# Patient Record
Sex: Female | Born: 1970 | Race: White | Hispanic: No | Marital: Married | Smoking: Never smoker
Health system: Southern US, Community
[De-identification: ages and names within clinical notes are randomized; demographics above are authoritative.]

## PROBLEM LIST (undated history)

## (undated) DIAGNOSIS — R51 Headache: Secondary | ICD-10-CM

## (undated) DIAGNOSIS — N926 Irregular menstruation, unspecified: Secondary | ICD-10-CM

## (undated) DIAGNOSIS — Z8759 Personal history of other complications of pregnancy, childbirth and the puerperium: Secondary | ICD-10-CM

## (undated) DIAGNOSIS — O1490 Unspecified pre-eclampsia, unspecified trimester: Secondary | ICD-10-CM

## (undated) DIAGNOSIS — R002 Palpitations: Secondary | ICD-10-CM

## (undated) DIAGNOSIS — F410 Panic disorder [episodic paroxysmal anxiety] without agoraphobia: Secondary | ICD-10-CM

## (undated) DIAGNOSIS — I10 Essential (primary) hypertension: Secondary | ICD-10-CM

## (undated) DIAGNOSIS — Z862 Personal history of diseases of the blood and blood-forming organs and certain disorders involving the immune mechanism: Secondary | ICD-10-CM

## (undated) DIAGNOSIS — R011 Cardiac murmur, unspecified: Secondary | ICD-10-CM

## (undated) DIAGNOSIS — E875 Hyperkalemia: Secondary | ICD-10-CM

## (undated) DIAGNOSIS — Z8639 Personal history of other endocrine, nutritional and metabolic disease: Secondary | ICD-10-CM

## (undated) DIAGNOSIS — N959 Unspecified menopausal and perimenopausal disorder: Secondary | ICD-10-CM

## (undated) DIAGNOSIS — F419 Anxiety disorder, unspecified: Secondary | ICD-10-CM

## (undated) DIAGNOSIS — Z87898 Personal history of other specified conditions: Secondary | ICD-10-CM

## (undated) HISTORY — DX: Unspecified pre-eclampsia, unspecified trimester: O14.90

## (undated) HISTORY — DX: Panic disorder (episodic paroxysmal anxiety): F41.0

## (undated) HISTORY — DX: Personal history of other endocrine, nutritional and metabolic disease: Z86.39

## (undated) HISTORY — DX: Palpitations: R00.2

## (undated) HISTORY — DX: Personal history of other complications of pregnancy, childbirth and the puerperium: Z87.59

## (undated) HISTORY — DX: Hyperkalemia: E87.5

## (undated) HISTORY — DX: Unspecified menopausal and perimenopausal disorder: N95.9

## (undated) HISTORY — DX: Personal history of diseases of the blood and blood-forming organs and certain disorders involving the immune mechanism: Z86.2

## (undated) HISTORY — DX: Personal history of other specified conditions: Z87.898

## (undated) HISTORY — DX: Cardiac murmur, unspecified: R01.1

## (undated) HISTORY — DX: Irregular menstruation, unspecified: N92.6

## (undated) HISTORY — DX: Headache: R51

## (undated) HISTORY — DX: Essential (primary) hypertension: I10

---
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
## 2000-11-08 ENCOUNTER — Inpatient Hospital Stay (HOSPITAL_COMMUNITY): Admission: AD | Admit: 2000-11-08 | Discharge: 2000-11-10 | Payer: Self-pay | Admitting: Obstetrics and Gynecology

## 2000-11-11 ENCOUNTER — Encounter: Admission: RE | Admit: 2000-11-11 | Discharge: 2000-12-11 | Payer: Self-pay | Admitting: Obstetrics and Gynecology

## 2001-02-26 ENCOUNTER — Other Ambulatory Visit: Admission: RE | Admit: 2001-02-26 | Discharge: 2001-02-26 | Payer: Self-pay | Admitting: Obstetrics and Gynecology

## 2001-12-13 ENCOUNTER — Encounter: Payer: Self-pay | Admitting: Obstetrics and Gynecology

## 2001-12-13 ENCOUNTER — Ambulatory Visit (HOSPITAL_COMMUNITY): Admission: RE | Admit: 2001-12-13 | Discharge: 2001-12-13 | Payer: Self-pay | Admitting: Obstetrics and Gynecology

## 2002-04-11 ENCOUNTER — Ambulatory Visit (HOSPITAL_COMMUNITY): Admission: RE | Admit: 2002-04-11 | Discharge: 2002-04-11 | Payer: Self-pay | Admitting: Obstetrics and Gynecology

## 2002-04-11 ENCOUNTER — Encounter: Payer: Self-pay | Admitting: Obstetrics and Gynecology

## 2002-05-20 ENCOUNTER — Inpatient Hospital Stay (HOSPITAL_COMMUNITY): Admission: AD | Admit: 2002-05-20 | Discharge: 2002-05-22 | Payer: Self-pay | Admitting: Obstetrics and Gynecology

## 2002-05-23 ENCOUNTER — Encounter: Admission: RE | Admit: 2002-05-23 | Discharge: 2002-06-22 | Payer: Self-pay | Admitting: Obstetrics and Gynecology

## 2002-07-01 ENCOUNTER — Other Ambulatory Visit: Admission: RE | Admit: 2002-07-01 | Discharge: 2002-07-01 | Payer: Self-pay | Admitting: Obstetrics and Gynecology

## 2003-03-22 ENCOUNTER — Emergency Department (HOSPITAL_COMMUNITY): Admission: EM | Admit: 2003-03-22 | Discharge: 2003-03-22 | Payer: Self-pay | Admitting: Emergency Medicine

## 2005-03-20 LAB — CONVERTED CEMR LAB: Pap Smear: NORMAL

## 2005-04-14 ENCOUNTER — Other Ambulatory Visit: Admission: RE | Admit: 2005-04-14 | Discharge: 2005-04-14 | Payer: Self-pay | Admitting: Obstetrics and Gynecology

## 2007-03-21 DIAGNOSIS — Z8759 Personal history of other complications of pregnancy, childbirth and the puerperium: Secondary | ICD-10-CM

## 2007-03-21 HISTORY — DX: Personal history of other complications of pregnancy, childbirth and the puerperium: Z87.59

## 2007-07-12 ENCOUNTER — Ambulatory Visit: Payer: Self-pay | Admitting: Internal Medicine

## 2007-07-12 DIAGNOSIS — R011 Cardiac murmur, unspecified: Secondary | ICD-10-CM

## 2007-07-12 DIAGNOSIS — M546 Pain in thoracic spine: Secondary | ICD-10-CM

## 2007-07-12 DIAGNOSIS — R51 Headache: Secondary | ICD-10-CM

## 2007-07-12 DIAGNOSIS — R030 Elevated blood-pressure reading, without diagnosis of hypertension: Secondary | ICD-10-CM

## 2007-07-12 DIAGNOSIS — R519 Headache, unspecified: Secondary | ICD-10-CM | POA: Insufficient documentation

## 2007-07-12 DIAGNOSIS — G4700 Insomnia, unspecified: Secondary | ICD-10-CM

## 2007-07-12 DIAGNOSIS — N943 Premenstrual tension syndrome: Secondary | ICD-10-CM | POA: Insufficient documentation

## 2007-07-12 HISTORY — DX: Cardiac murmur, unspecified: R01.1

## 2007-07-29 ENCOUNTER — Encounter: Payer: Self-pay | Admitting: Internal Medicine

## 2007-08-30 ENCOUNTER — Ambulatory Visit: Payer: Self-pay | Admitting: Internal Medicine

## 2007-09-01 LAB — CONVERTED CEMR LAB
ALT: 16 units/L (ref 0–35)
AST: 18 units/L (ref 0–37)
Albumin: 4.3 g/dL (ref 3.5–5.2)
Alkaline Phosphatase: 46 units/L (ref 39–117)
BUN: 11 mg/dL (ref 6–23)
Basophils Absolute: 0 10*3/uL (ref 0.0–0.1)
Basophils Relative: 0.4 % (ref 0.0–1.0)
Bilirubin, Direct: 0.1 mg/dL (ref 0.0–0.3)
CO2: 25 meq/L (ref 19–32)
Calcium: 9.4 mg/dL (ref 8.4–10.5)
Chloride: 104 meq/L (ref 96–112)
Cholesterol: 177 mg/dL (ref 0–200)
Creatinine, Ser: 0.7 mg/dL (ref 0.4–1.2)
Eosinophils Absolute: 0 10*3/uL (ref 0.0–0.7)
Eosinophils Relative: 0.7 % (ref 0.0–5.0)
GFR calc Af Amer: 122 mL/min
GFR calc non Af Amer: 101 mL/min
Glucose, Bld: 87 mg/dL (ref 70–99)
HCT: 39.5 % (ref 36.0–46.0)
HDL: 66 mg/dL (ref >39.0)
Hemoglobin: 13.8 g/dL (ref 12.0–15.0)
LDL Cholesterol: 103 mg/dL — ABNORMAL HIGH (ref 0–99)
Lymphocytes Relative: 31.2 % (ref 12.0–46.0)
MCHC: 34.9 g/dL (ref 30.0–36.0)
MCV: 86.5 fL (ref 78.0–100.0)
Monocytes Absolute: 0.5 10*3/uL (ref 0.1–1.0)
Monocytes Relative: 8.2 % (ref 3.0–12.0)
Neutro Abs: 3.6 10*3/uL (ref 1.4–7.7)
Neutrophils Relative %: 59.5 % (ref 43.0–77.0)
Platelets: 210 10*3/uL (ref 150–400)
Potassium: 3.4 meq/L — ABNORMAL LOW (ref 3.5–5.1)
RBC: 4.56 M/uL (ref 3.87–5.11)
RDW: 12.5 % (ref 11.5–14.6)
Sodium: 138 meq/L (ref 135–145)
TSH: 2.37 microintl units/mL (ref 0.35–5.50)
Total Bilirubin: 0.7 mg/dL (ref 0.3–1.2)
Total CHOL/HDL Ratio: 2.7
Total Protein: 7.2 g/dL (ref 6.0–8.3)
Triglycerides: 40 mg/dL (ref 0–149)
VLDL: 8 mg/dL (ref 0–40)
WBC: 5.9 10*3/uL (ref 4.5–10.5)

## 2007-09-06 ENCOUNTER — Ambulatory Visit: Payer: Self-pay | Admitting: Internal Medicine

## 2007-09-06 DIAGNOSIS — N926 Irregular menstruation, unspecified: Secondary | ICD-10-CM

## 2007-09-06 HISTORY — DX: Irregular menstruation, unspecified: N92.6

## 2007-09-06 LAB — CONVERTED CEMR LAB: Beta hcg, urine, semiquantitative: POSITIVE

## 2007-10-04 ENCOUNTER — Ambulatory Visit: Payer: Self-pay | Admitting: Internal Medicine

## 2007-10-04 DIAGNOSIS — J019 Acute sinusitis, unspecified: Secondary | ICD-10-CM

## 2008-02-11 ENCOUNTER — Ambulatory Visit: Payer: Self-pay | Admitting: Internal Medicine

## 2008-02-12 ENCOUNTER — Encounter (INDEPENDENT_AMBULATORY_CARE_PROVIDER_SITE_OTHER): Payer: Self-pay | Admitting: Internal Medicine

## 2008-02-12 ENCOUNTER — Observation Stay (HOSPITAL_COMMUNITY): Admission: EM | Admit: 2008-02-12 | Discharge: 2008-02-12 | Payer: Self-pay | Admitting: Emergency Medicine

## 2008-02-18 ENCOUNTER — Ambulatory Visit: Payer: Self-pay | Admitting: Internal Medicine

## 2008-02-18 DIAGNOSIS — I10 Essential (primary) hypertension: Secondary | ICD-10-CM | POA: Insufficient documentation

## 2008-02-18 DIAGNOSIS — Z862 Personal history of diseases of the blood and blood-forming organs and certain disorders involving the immune mechanism: Secondary | ICD-10-CM

## 2008-02-18 DIAGNOSIS — Z8639 Personal history of other endocrine, nutritional and metabolic disease: Secondary | ICD-10-CM

## 2008-02-18 HISTORY — DX: Personal history of diseases of the blood and blood-forming organs and certain disorders involving the immune mechanism: Z86.2

## 2008-02-24 ENCOUNTER — Telehealth: Payer: Self-pay | Admitting: Internal Medicine

## 2008-03-06 ENCOUNTER — Ambulatory Visit: Payer: Self-pay | Admitting: Internal Medicine

## 2008-03-10 ENCOUNTER — Ambulatory Visit: Payer: Self-pay | Admitting: Internal Medicine

## 2008-03-10 LAB — CONVERTED CEMR LAB
BUN: 12 mg/dL (ref 6–23)
CO2: 28 meq/L (ref 19–32)
Calcium: 9.7 mg/dL (ref 8.4–10.5)
Chloride: 107 meq/L (ref 96–112)
Creatinine, Ser: 0.7 mg/dL (ref 0.4–1.2)
GFR calc Af Amer: 121 mL/min
GFR calc non Af Amer: 100 mL/min
Glucose, Bld: 95 mg/dL (ref 70–99)
Potassium: 4.2 meq/L (ref 3.5–5.1)
Sodium: 140 meq/L (ref 135–145)

## 2008-03-23 ENCOUNTER — Encounter: Payer: Self-pay | Admitting: Internal Medicine

## 2008-04-10 ENCOUNTER — Ambulatory Visit: Payer: Self-pay | Admitting: Internal Medicine

## 2008-04-10 DIAGNOSIS — R5381 Other malaise: Secondary | ICD-10-CM

## 2008-04-10 DIAGNOSIS — R5383 Other fatigue: Secondary | ICD-10-CM

## 2008-05-08 ENCOUNTER — Ambulatory Visit: Payer: Self-pay | Admitting: Internal Medicine

## 2008-05-08 DIAGNOSIS — J310 Chronic rhinitis: Secondary | ICD-10-CM | POA: Insufficient documentation

## 2008-05-08 LAB — CONVERTED CEMR LAB
BUN: 14 mg/dL (ref 6–23)
CO2: 29 meq/L (ref 19–32)
Calcium: 9.6 mg/dL (ref 8.4–10.5)
Chloride: 103 meq/L (ref 96–112)
Creatinine, Ser: 0.7 mg/dL (ref 0.4–1.2)
GFR calc Af Amer: 121 mL/min
GFR calc non Af Amer: 100 mL/min
Glucose, Bld: 86 mg/dL (ref 70–99)
Hep B Core Total Ab: NEGATIVE
Hep B S AB Quant (Post): 1000 milliintl units/mL
Hep B S Ab: POSITIVE — AB
Hepatitis B Surface Ag: NEGATIVE
Potassium: 3.8 meq/L (ref 3.5–5.1)
Sodium: 141 meq/L (ref 135–145)

## 2008-05-11 ENCOUNTER — Telehealth (INDEPENDENT_AMBULATORY_CARE_PROVIDER_SITE_OTHER): Payer: Self-pay | Admitting: *Deleted

## 2008-06-08 ENCOUNTER — Telehealth: Payer: Self-pay | Admitting: *Deleted

## 2008-09-14 ENCOUNTER — Telehealth: Payer: Self-pay | Admitting: Internal Medicine

## 2009-01-29 ENCOUNTER — Ambulatory Visit: Payer: Self-pay | Admitting: Internal Medicine

## 2009-02-04 ENCOUNTER — Telehealth: Payer: Self-pay | Admitting: *Deleted

## 2009-03-01 ENCOUNTER — Encounter (INDEPENDENT_AMBULATORY_CARE_PROVIDER_SITE_OTHER): Payer: Self-pay | Admitting: *Deleted

## 2009-03-05 ENCOUNTER — Telehealth: Payer: Self-pay | Admitting: Internal Medicine

## 2009-03-05 ENCOUNTER — Ambulatory Visit: Payer: Self-pay | Admitting: Internal Medicine

## 2009-03-05 LAB — CONVERTED CEMR LAB
ALT: 15 units/L (ref 0–35)
AST: 19 units/L (ref 0–37)
Alkaline Phosphatase: 40 units/L (ref 39–117)
Basophils Relative: 0.5 % (ref 0.0–3.0)
Bilirubin, Direct: 0.1 mg/dL (ref 0.0–0.3)
CO2: 27 meq/L (ref 19–32)
Chloride: 102 meq/L (ref 96–112)
Creatinine, Ser: 0.6 mg/dL (ref 0.4–1.2)
Eosinophils Absolute: 0.1 10*3/uL (ref 0.0–0.7)
Eosinophils Relative: 1.3 % (ref 0.0–5.0)
Free T4: 1 ng/dL (ref 0.6–1.6)
HCT: 37.5 % (ref 36.0–46.0)
LDL Cholesterol: 103 mg/dL — ABNORMAL HIGH (ref 0–99)
Lymphs Abs: 1.5 10*3/uL (ref 0.7–4.0)
MCHC: 33.1 g/dL (ref 30.0–36.0)
MCV: 91.4 fL (ref 78.0–100.0)
Monocytes Absolute: 0.6 10*3/uL (ref 0.1–1.0)
Platelets: 180 10*3/uL (ref 150.0–400.0)
Potassium: 3.6 meq/L (ref 3.5–5.1)
Sodium: 137 meq/L (ref 135–145)
TSH: 1.34 microintl units/mL (ref 0.35–5.50)
Total Bilirubin: 0.8 mg/dL (ref 0.3–1.2)
Total CHOL/HDL Ratio: 3
WBC: 6.9 10*3/uL (ref 4.5–10.5)

## 2009-03-08 ENCOUNTER — Telehealth: Payer: Self-pay | Admitting: Internal Medicine

## 2009-07-16 ENCOUNTER — Ambulatory Visit: Payer: Self-pay | Admitting: Internal Medicine

## 2009-07-16 DIAGNOSIS — F411 Generalized anxiety disorder: Secondary | ICD-10-CM

## 2009-08-23 ENCOUNTER — Telehealth: Payer: Self-pay | Admitting: Internal Medicine

## 2010-04-05 ENCOUNTER — Encounter: Payer: Self-pay | Admitting: *Deleted

## 2010-04-19 NOTE — Assessment & Plan Note (Signed)
Summary: 6 month follow up/cjr pt rsc/njr rsc per doc/njr--PT [facility] // RS   Vital Signs:  Patient profile:   40 year old female Menstrual status:  regular LMP:     07/16/2009 Weight:      113 pounds Pulse rate:   78 / minute BP sitting:   100 / 70  (right arm) Cuff size:   regular  Vitals Entered By: Romualdo Bolk, CMA (AAMA) (July 16, 2009 2:16 PM)  Serial Vital Signs/Assessments:  Time      Position  BP       Pulse  Resp  Temp     By                     100/60                         Madelin Headings MD  Comments: righ t arm sitting By: Madelin Headings MD   CC: follow-up visit, Hypertension Management, Ha's have gotten worse in the past 6 months, no energy, bp lower than normal. Ha's more menstral related at the beginning and end of cycle. Pt has 3 ha's a month that are bad enough to take imitrex. But total 5 ha's a month. LMP (date): 07/16/2009 LMP - Character: normal Menarche (age onset years): 15   Menses interval (days): 28 Menstrual flow (days): 5-6 Enter LMP: 07/16/2009 Last PAP Result Normal   History of Present Illness: Anna Poole comes in today  for above   for 6 months check for a number of issues .  BP  : low for her   recently     at home it is 80 /66  range :  exercising again  without restriction    Exercising  but tired after this  . Anxiety  pretty good. stable on low dose lexapro. Headaches  Some increase  more recently    seems to be hormone triggered.      just finished   last refill of imitrex   . need refill . No other changes of concern. HCM: Has gyne appt soon. no concerns .    Hypertension History:      She complains of headache, but denies chest pain, palpitations, dyspnea with exertion, orthopnea, PND, peripheral edema, visual symptoms, neurologic problems, syncope, and side effects from treatment.  She notes no problems with any antihypertensive medication side effects.        Positive major cardiovascular risk factors include  hypertension.  Negative major cardiovascular risk factors include female age less than 40 years old and non-tobacco-user status.     Preventive Screening-Counseling & Management  Alcohol-Tobacco     Alcohol drinks/day: <1     Alcohol type: beer or wine     Smoking Status: never  Caffeine-Diet-Exercise     Caffeine use/day: 0     Does Patient Exercise: yes     Type of exercise: aerobic      Exercise (avg: min/session): <30     Times/week: 3  Current Medications (verified): 1)  Lexapro 5 Mg Tabs (Escitalopram Oxalate) .Marland Kitchen.. 1 By Mouth Once Daily 2)  Prinivil 10 Mg Tabs (Lisinopril) .Marland Kitchen.. 1 By Mouth Once Daily For Blood Pressure 3)  Imitrex 100 Mg Tabs (Sumatriptan Succinate) .Marland Kitchen.. 1 By Mouth As Needed For Headache Can Repeat in 2 Hours If Needed 4)  Bromfenex Pd 6-60 Mg Cr-Caps (Brompheniramine-Pseudoeph) .... One By Mouth Bid  Allergies (verified): No  Known Drug Allergies  Past History:  Past medical, surgical, family and social histories (including risk factors) reviewed, and no changes noted (except as noted below).  Past Medical History: Reviewed history from 05/08/2008 and no changes required. heart murmur     had neg echo and ekg  Headaches G3P3  Miscarraige 2009 HT in pregnancy pre eclampsia     Hosp  11 /09  HT and palpitations  and K 3.2   neg eval and nl echo. neg aldopra ratio  Consults Dr. Deedra Ehrich         Past Surgical History: Reviewed history from 02/18/2008 and no changes required. Ovarian Cyst removed 1991    Past History:  Care Management: Gynecology: Senaida Ores  Family History: Reviewed history from 05/08/2008 and no changes required. Family History Hypertension- maternal grandmother and mother Family History of Stroke M 1st degree relative <50- Paternal grandfather  Dm  moms ggf              Social History: Reviewed history from 01/29/2009 and no changes required. 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   hh of 5     Child ages  34,6,5      see data base   sleep    7-8 hours     Does Patient Exercise:  yes  Review of Systems  The patient denies anorexia, fever, weight loss, weight gain, vision loss, decreased hearing, hoarseness, chest pain, syncope, dyspnea on exertion, peripheral edema, prolonged cough, hemoptysis, abdominal pain, melena, hematochezia, severe indigestion/heartburn, hematuria, transient blindness, difficulty walking, abnormal bleeding, enlarged lymph nodes, and angioedema.    Physical Exam  General:  Well-developed,well-nourished,in no acute distress; alert,appropriate and cooperative throughout examination Head:  normocephalic and atraumatic.   Eyes:  vision grossly intact, pupils equal, and pupils round.   Mouth:  pharynx pink and moist.  tongue midline Neck:  No deformities, masses, or tenderness noted. thyroid palo Lungs:  Normal respiratory effort, chest expands symmetrically. Lungs are clear to auscultation, no crackles or wheezes. Heart:  Normal rate and regular rhythm. S1 and S2 normal without gallop, murmur, click, rub or other extra sounds.no lifts.   Abdomen:  Bowel sounds positive,abdomen soft and non-tender without masses, organomegaly or  noted. Msk:  no joint warmth and no redness over joints.   Pulses:  pulses intact without delay   Extremities:  no clubbing cyanosis or edema  Neurologic:  alert & oriented X3, strength normal in all extremities, and gait normal.   Skin:  turgor normal and color normal.   Cervical Nodes:  No lymphadenopathy noted Psych:  Oriented X3, good eye contact, not anxious appearing, and not depressed appearing.     Impression & Recommendations:  Problem # 1:  LABILE HYPERTENSION (ICD-401.9) Assessment Improved will decrease slowly and  continue exercise  increase    may be able to dc .   labs and thyroid     we normal in december 2010 Her  updated medication list for this problem includes:    Prinivil 10 Mg Tabs (Lisinopril) .Marland Kitchen... 1 by mouth once daily for blood pressure  Problem # 2:  HEADACHE (ICD-784.0) menstrual triggers   no change  ok to refill meds  The following medications were removed from the medication list:    Norco 5-325 Mg Tabs (Hydrocodone-acetaminophen) .Marland Kitchen... 1 by mouth q4-6 hours  as needed  for  headache  as a rescue Her updated medication list for this problem includes:    Imitrex 100 Mg Tabs (Sumatriptan succinate) .Marland Kitchen... 1 by mouth as needed for headache can repeat in 2 hours if needed  Problem # 3:  ANXIETY (ICD-300.00)  stable  for now   .    Her updated medication list for this problem includes:    Lexapro 5 Mg Tabs (Escitalopram oxalate) .Marland Kitchen... 1 by mouth once daily  Complete Medication List: 1)  Lexapro 5 Mg Tabs (Escitalopram oxalate) .Marland Kitchen.. 1 by mouth once daily 2)  Prinivil 10 Mg Tabs (Lisinopril) .Marland Kitchen.. 1 by mouth once daily for blood pressure 3)  Imitrex 100 Mg Tabs (Sumatriptan succinate) .Marland Kitchen.. 1 by mouth as needed for headache can repeat in 2 hours if needed 4)  Bromfenex Pd 6-60 Mg Cr-caps (Brompheniramine-pseudoeph) .... One by mouth bid  Hypertension Assessment/Plan:      The patient's hypertensive risk group is category A: No risk factors and no target organ damage.  Her calculated 10 year risk of coronary heart disease is 1 %.  Today's blood pressure is 100/70.  Her blood pressure goal is < 140/90.  Patient Instructions: 1)  decrease lisinopril to  5 mg per day  for 3-4 weeks   and then call with readings and plan.   Prescriptions: IMITREX 100 MG TABS (SUMATRIPTAN SUCCINATE) 1 by mouth as needed for headache can repeat in 2 hours if needed  #9 x 2   Entered and Authorized by:   Madelin Headings MD   Signed by:   Madelin Headings MD on 07/16/2009   Method used:   Electronically to        [facility]  [phone]* (retail)       [address] [place], [place]  [address]       Ph:  [phone] or [phone]       Fax: [phone]   RxID:   [id number]

## 2010-04-19 NOTE — Progress Notes (Signed)
Summary: panic attacks  Phone Note Call from Patient   Caller: Patient Call For: Madelin Headings MD Summary of Call: Pt has had 2 panic attacks and would like to increase her Lexapro to 20 mg. daily. [phone] Initial call taken by: Lynann Beaver CMA,  August 23, 2009 9:41 AM  Follow-up for Phone Call        ok to increase dose but if she is on 5 woudl go up to 10 before using 20 per day. also  if she wishes we can call in a small amount of ativan in the meantime  .5 mg   disp 15  1 by mouth two times a day as needed .  Have rov 3 weeks after  increasing dose of meds.  Follow-up by: Madelin Headings MD,  August 23, 2009 1:01 PM    New/Updated Medications: LEXAPRO 10 MG TABS (ESCITALOPRAM OXALATE) one by mouth daily Prescriptions: LEXAPRO 10 MG TABS (ESCITALOPRAM OXALATE) one by mouth daily  #30 x 5   Entered by:   Lynann Beaver CMA   Authorized by:   Madelin Headings MD   Signed by:   Lynann Beaver CMA on 08/23/2009   Method used:   Electronically to        [facility]  [phone]* (retail)       [address] [place], [place]  [address]       Ph: [phone] or [phone]       Fax: [phone]   RxID:   [id number]  Pt notified.  Appended Document: panic attacks Pt does not want the Ativan at this time.

## 2010-04-21 NOTE — Letter (Signed)
Summary: Generic Letter  [facility]  [address] [place], [place] [address]   Phone: [phone]  Fax: [phone]    04/05/2010  [facility] [address] [place], [place]  [address]  Dear Ms. Wight,  We have tried to contact you about a refill request. Please give [place] a call at [phone] about this.         Sincerely,   Tor Netters, CMA (AAMA)

## 2010-05-06 ENCOUNTER — Telehealth: Payer: Self-pay | Admitting: Internal Medicine

## 2010-05-06 NOTE — Telephone Encounter (Signed)
Per Dr Fabian Sharp prefers Friday mornings

## 2010-05-06 NOTE — Telephone Encounter (Signed)
Pt is requesting cpx on a Friday after 12 noon. Can I create a slot?

## 2010-05-08 ENCOUNTER — Other Ambulatory Visit: Payer: Self-pay | Admitting: Internal Medicine

## 2010-05-09 ENCOUNTER — Telehealth: Payer: Self-pay | Admitting: Internal Medicine

## 2010-05-09 NOTE — Telephone Encounter (Signed)
Pt is sch for cpx on 07-01-2010 10am

## 2010-05-09 NOTE — Telephone Encounter (Signed)
Error/njr °

## 2010-05-09 NOTE — Telephone Encounter (Signed)
Rx sent and letter mailed to pt.

## 2010-06-17 ENCOUNTER — Other Ambulatory Visit (INDEPENDENT_AMBULATORY_CARE_PROVIDER_SITE_OTHER): Payer: BC Managed Care – PPO | Admitting: Internal Medicine

## 2010-06-17 DIAGNOSIS — Z0000 Encounter for general adult medical examination without abnormal findings: Secondary | ICD-10-CM

## 2010-06-17 LAB — LIPID PANEL
Cholesterol: 201 mg/dL — ABNORMAL HIGH (ref 0–200)
HDL: 73.8 mg/dL (ref >39.00)
Triglycerides: 60 mg/dL (ref 0.0–149.0)
VLDL: 12 mg/dL (ref 0.0–40.0)

## 2010-06-17 LAB — CBC WITH DIFFERENTIAL/PLATELET
Basophils Absolute: 0 10*3/uL (ref 0.0–0.1)
HCT: 35.7 % — ABNORMAL LOW (ref 36.0–46.0)
Lymphs Abs: 1.7 10*3/uL (ref 0.7–4.0)
MCV: 87.3 fl (ref 78.0–100.0)
Monocytes Absolute: 0.5 10*3/uL (ref 0.1–1.0)
Neutrophils Relative %: 53.1 % (ref 43.0–77.0)
Platelets: 183 10*3/uL (ref 150.0–400.0)
RDW: 13.3 % (ref 11.5–14.6)

## 2010-06-17 LAB — TSH: TSH: 1.65 u[IU]/mL (ref 0.35–5.50)

## 2010-06-17 LAB — POCT URINALYSIS DIPSTICK
Bilirubin, UA: NEGATIVE
Glucose, UA: NEGATIVE
Ketones, UA: NEGATIVE
Nitrite, UA: NEGATIVE

## 2010-06-17 LAB — HEPATIC FUNCTION PANEL
Bilirubin, Direct: 0.1 mg/dL (ref 0.0–0.3)
Total Bilirubin: 0.6 mg/dL (ref 0.3–1.2)

## 2010-06-17 LAB — BASIC METABOLIC PANEL
CO2: 28 mEq/L (ref 19–32)
Calcium: 9.7 mg/dL (ref 8.4–10.5)
Creatinine, Ser: 0.8 mg/dL (ref 0.4–1.2)
GFR: 84.69 mL/min (ref >60.00)

## 2010-06-30 ENCOUNTER — Encounter: Payer: Self-pay | Admitting: Internal Medicine

## 2010-07-01 ENCOUNTER — Encounter: Payer: Self-pay | Admitting: Internal Medicine

## 2010-07-08 ENCOUNTER — Encounter: Payer: Self-pay | Admitting: Internal Medicine

## 2010-07-08 ENCOUNTER — Ambulatory Visit (INDEPENDENT_AMBULATORY_CARE_PROVIDER_SITE_OTHER): Payer: BC Managed Care – PPO | Admitting: Internal Medicine

## 2010-07-08 VITALS — BP 100/60 | HR 66 | Ht 63.5 in | Wt 112.0 lb

## 2010-07-08 DIAGNOSIS — G4700 Insomnia, unspecified: Secondary | ICD-10-CM

## 2010-07-08 DIAGNOSIS — E875 Hyperkalemia: Secondary | ICD-10-CM

## 2010-07-08 DIAGNOSIS — N943 Premenstrual tension syndrome: Secondary | ICD-10-CM

## 2010-07-08 DIAGNOSIS — F411 Generalized anxiety disorder: Secondary | ICD-10-CM

## 2010-07-08 DIAGNOSIS — R51 Headache: Secondary | ICD-10-CM

## 2010-07-08 DIAGNOSIS — R030 Elevated blood-pressure reading, without diagnosis of hypertension: Secondary | ICD-10-CM

## 2010-07-08 DIAGNOSIS — Z0000 Encounter for general adult medical examination without abnormal findings: Secondary | ICD-10-CM

## 2010-07-08 DIAGNOSIS — I10 Essential (primary) hypertension: Secondary | ICD-10-CM

## 2010-07-08 HISTORY — DX: Hyperkalemia: E87.5

## 2010-07-08 LAB — BASIC METABOLIC PANEL
BUN: 16 mg/dL (ref 6–23)
Calcium: 9.7 mg/dL (ref 8.4–10.5)
GFR: 94.11 mL/min (ref >60.00)
Glucose, Bld: 76 mg/dL (ref 70–99)
Sodium: 140 mEq/L (ref 135–145)

## 2010-07-08 MED ORDER — FROVATRIPTAN SUCCINATE 2.5 MG PO TABS: ORAL_TABLET | ORAL | Status: DC

## 2010-07-08 MED ORDER — ESCITALOPRAM OXALATE 10 MG PO TABS: 10.0000 mg | ORAL_TABLET | Freq: Every day | ORAL | Status: DC

## 2010-07-08 MED ORDER — LISINOPRIL 10 MG PO TABS: 10.0000 mg | ORAL_TABLET | Freq: Every day | ORAL | Status: DC

## 2010-07-08 NOTE — Progress Notes (Signed)
Subjective:    Patient ID: Anna Poole, female    DOB: Sep 12, 1970, 40 y.o.   MRN: [id number]  HPI Patient comes in today for a checkup and medical management. Since her last visit she has had no major changes in her health. Although she did get some panic attacks when her grandmother became ill. She thinks the Lexapro has helped calm down however. No major injuries emergency room visits. Headaches most recently she is getting her menstrual migraine almost every month and last for 3 days. She takes one Imitrex a day and it helps some but keeps coming back. She has taken ibuprofen in the past but thinks it didn't help.  Has regular periods heavy a couple days last 7 days  Sleep: Somewhat worse over the last month gets ominous nightmares at times seems to be able to fall asleep but when she wakens cannot go back to sleep. No alcohol has otherwise good sleep hygiene. No sleep apnea. Blood pressure: Doing very well able to exercise and feel well when she does.     Review of Systems ocass panic attack with gm  Illness.   Condoms  For contraception. Neg cp sob or dec exercise tolerance or sx .   Vision hearing joint issues or skin.   Sees GYNE. Rest of ros neg or as per hpi.   Past Medical History  Diagnosis Date  . Heart murmur     had neg echo and ekg  . Headache   . Preeclampsia     ht in pregnancy   . Hypertension     Hosp 11/09 and K 3.2 neg eval, nl echo and neg aldopra ratio  . Palpitations   . History of miscarriage 2009   Past Surgical History  Procedure Date  . Ovarian cyst removal 1991    [facility]    reports that she has never smoked. She does not have any smokeless tobacco history on file. She reports that she drinks alcohol. She reports that she does not use illicit drugs. family history includes Diabetes type II in an unspecified family member; Hypertension in her mother; and Stroke in an unspecified family member. No Known Allergies     Objective:   Physical  Exam Physical Exam: Vital signs reviewed WUJ:WJXB is a well-developed well-nourished alert cooperative  white female who appears her stated age in no acute distress.  HEENT: normocephalic  traumatic , Eyes: PERRL EOM's full, conjunctiva clear, Nares: paten,t no deformity discharge or tenderness., Ears: no deformity EAC's clear TMs with normal landmarks. Mouth: clear OP, no lesions, edema.  Moist mucous membranes. Dentition in adequate repair. NECK: supple without masses, thyromegaly or bruits. CHEST/PULM:  Clear to auscultation and percussion breath sounds equal no wheeze , rales or rhonchi. No chest wall deformities or tenderness. Breast: normal by inspection . No dimpling, discharge, masses, tenderness or discharge . LN: no cervical axillary inguinal adenopathy CV: PMI is nondisplaced, S1 S2 no gallops, murmurs, rubs. Peripheral pulses are full without delay.No JVD .  ABDOMEN: Bowel sounds normal nontender  No guard or rebound, no hepato splenomegal no CVA tenderness.  No hernia. Extremtities:  No clubbing cyanosis or edema, no acute joint swelling or redness no focal atrophy NEURO:  Oriented x3, cranial nerves 3-12 appear to be intact, no obvious focal weakness,gait within normal limits no abnormal reflexes or asymmetrical SKIN: No acute rashes normal turgor, color, no bruising or petechiae. Sun changes  Clear  GYNE does pap and pelvic.  EKG NSR no acute  changes LAb nl but K 5.4 could be lab effect  As has hx of hypokalemia.   PSYCH: Oriented, good eye contact, no obvious depression anxiety, cognition and judgment appear normal.      Assessment & Plan:  Preventive Health Care Continue lifestyle intervention healthy eating and exercise .   Counseled about healthy diet ,nutrition, exercise, sun protection, calcium vitamin D and injury prevention.   Menstrual migraine   Problematic    Change from imitrex to  frova suppression dosing     Disc use of frova and   Prevention. Consider Mirena  iud  To suppress periods   As using condoms and done want her to take estrogen cause of  BP.   Cautioned about serotonin syndrome Anxiety  Controlled to some degree and help with lexapro. Sleep  Always a problem  Disc strategies.  Hypertension low dose lisinopril 5 mg controlled  Elevated K  On labs suspect lab drawing effect.   Not on K supp.  ekg nl  Usually runs low  Disc with pt repeat today

## 2010-07-08 NOTE — Patient Instructions (Signed)
We'll let you know about potassium level when available. We can try a different tripped and then have you take 2 Aleve at the onset also. See if this helps relieve her menstrual migraine. Consider Mirena IUD to suppress ovulation and hormonal swings it's possible this will help. Consider cognitive therapy for sleep continue sleep hygiene.

## 2010-07-08 NOTE — Assessment & Plan Note (Signed)
Every month  Consider long acting   triptan .    Consider mirena iud.

## 2010-07-09 ENCOUNTER — Encounter: Payer: Self-pay | Admitting: Internal Medicine

## 2010-07-11 ENCOUNTER — Encounter: Payer: Self-pay | Admitting: *Deleted

## 2010-07-12 ENCOUNTER — Other Ambulatory Visit: Payer: Self-pay | Admitting: Internal Medicine

## 2010-08-02 NOTE — H&P (Signed)
Anna Poole, Anna Poole NO.:  [id number]   MEDICAL RECORD NO.:  [id number]          PATIENT TYPE:  EMS   LOCATION:  MAJO                         FACILITY:  [facility]   PHYSICIAN:  Michiel Cowboy, MDDATE OF BIRTH:  1970/06/28   DATE OF ADMISSION:  02/11/2008  DATE OF DISCHARGE:                              HISTORY & PHYSICAL   PRIMARY CARE Andree Golphin:  Neta Mends. Panosh, M.D.   CHIEF COMPLAINT:  Chest palpitations and shortness of breath.   Patient is a 40 year old female with a past medical history of migraines  and hypertension during pregnancy who presents with progressive  palpitations over the past week or so, a couple of weeks, induced by  anxiety and stress.  Feels like heart is racing in her chest with  hyperdynamic pulse, which she feels, as well as a sensation of kind of  catching her breath.  She has been having hypertension for quite some  time and remembers even in college for some reason was even evaluated  for elevated blood pressure as well but then resolved.  She also during  her pregnancies had to be started on blood pressure medication but  stopped it because it was making her feel bad.  She always describes  herself as a very hyper person.  She denies any weight loss, although  appears thin.  No skin changes.  No nail changes.  No hair changes.  She  thinks that her thyroid was checked recently by her primary care  physician, although she is not sure.  Her potassium was noted to be low  during her past evaluation, which was replaced.  She denies any nausea,  vomiting, diarrhea.  The chest pressure sensation she reports as a  pinching sensation, which is substernal in a small spot and happens  mostly when she breathes.  She is currently chest painfree.  Does not  happen with every breath.   PAST MEDICAL HISTORY:  Significant for migraines and hypertension.   FAMILY HISTORY:  Significant for hypertension, otherwise unremarkable.   SOCIAL  HISTORY:  Patient does not drink, use drugs, or smoke.   ALLERGIES:  No known drug allergies.   MEDICATIONS:  None.   PHYSICAL EXAMINATION:  Temperature 97.4, blood pressure 172/106, now  down to 149/93, pulse 121, now 102, respirations 16, satting 100% on  room air.  Patient appears to be in no acute distress, a thin female lying in bed.  Head atraumatic.  Moist mucous membranes.  LUNGS:  Clear to auscultation bilaterally.  HEART:  Rapid but regular.  No murmurs appreciated.  ABDOMEN:  Soft, nontender, nondistended.  LOWER EXTREMITIES:  No clubbing, cyanosis or edema.  Cranial nerves II-XII intact.  Strength 5/5 in all 4 extremities.   LABS:  White blood cell count 5.8, hemoglobin 13.8.  Sodium 137,  potassium 3.2, creatinine 0.7.  LFTs within normal limits.  Cardiac  enzymes within normal limits.  D-dimer 0.25, which is normal.  Urine  pregnancy test negative.  UA showing 3-6 white blood cells, nitrites  negative.   Chest x-ray within normal limits.   EKG showing ST  depression of about 1 mm in leads 2, 3, and aVF.  S waves  in the majority of leads.  There is a questionable bundle branch block  and R prime in V1 with T wave inversion in V1 as well.  Overall normal  EKG.   ASSESSMENT/PLAN:  This is a 40 year old with a history of palpitations,  abnormal electrocardiogram, and tachycardia as well as hypertension.  1. Chest pressure/palpitations:  Given abnormal electrocardiogram,      will admit to telemetry bed, cycle cardiac enzymes.  Her age would      make acute coronary syndrome less likely, an extremely atypical      story, but nonetheless, will monitor and rule out for acute      myocardial infarction.  We will obtain a fasting lipid, hemoglobin      A1C, check TSH.  Will obtain a 2D echo.  Would recommend cardiology      consult.  Negative D-dimer would make PE less likely.  2. Hypokalemia:  Will replace and follow.  Will obtain urine      potassium, sodium, and  creatinine level.  Check magnesium level.  3. Tachycardia:  Will check TSH.  Give gentle IV fluids.  Obtain 2D      echo, given abnormal EKG.  May need to have follow-up cardiology      workup and perhaps a Holter monitor, given palpitations.  4. Hypertension:  Will give metoprolol.  Continue to monitor.  5. Prophylaxis:  Protonix plus Lovenox.      Michiel Cowboy, MD  Electronically Signed     AVD/MEDQ  D:  02/12/2008  T:  02/12/2008  Job:  [id number]   cc:   Neta Mends. Fabian Sharp, MD

## 2010-08-02 NOTE — Discharge Summary (Signed)
NAMESTEWART, PIMENTA              ACCOUNT NO.:  [id number]   MEDICAL RECORD NO.:  [id number]          PATIENT TYPE:  OBV   LOCATION:  5522                         FACILITY:  [facility]   PHYSICIAN:  Valerie A. Felicity Coyer, MDDATE OF BIRTH:  Sep 14, 1970   DATE OF ADMISSION:  02/11/2008  DATE OF DISCHARGE:  02/12/2008                               DISCHARGE SUMMARY   PRIMARY CARE PHYSICIAN:  Neta Mends. Panosh, MD   DISCHARGE DIAGNOSIS:  Palpitations thought secondary to anxiety with  negative cardiac workup this admission.   HISTORY OF PRESENT ILLNESS:  Ms. Carolan is a 40 year old female with  history of migraines and hypertension who presented to [facility]  Emergency Department on day of admission with reports of progressive  palpitations over the last week.  She stated it induced by anxiety  distress.  The patient reported feeling like her heart was racing  accompanied by sensation of unable to catch her breath.  The patient  denied any nausea, vomiting, diaphoresis, described chest pressure  sensation as a pinching sensation occurring mostly upon inspiration.  The patient was admitted from the ER at that time for further workup to  rule out cardiac etiology of chest pain.   PAST MEDICAL HISTORY:  1. Migraines.  2. Hypertension.   COURSE OF HOSPITALIZATION:  1. Palpitations in setting of anxiety.  Again, the patient reports a      recent increase in stressors.  The patient's heart rate at the time      of admission was 121 at which time the patient was placed on beta-      blocker therapy to be continued at the time of discharge for both      palpitations and blood pressure control.  Cardiac enzymes were      negative x3.  Chest x-ray revealed no acute abnormalities.  Two-D      echocardiogram revealed left ventricular ejection fraction of 60-      65% with no wall motion abnormalities.  D-dimer was within normal      limits.  Again, the patient's symptoms thought likely secondary to      anxiety.  We would consider addition of an SSRI to the patient's      medication regimen.  However, we will defer this to the patient's      primary care physician at followup appointment.   MEDICATIONS AT THE TIME OF DISCHARGE:  Lopressor 25 mg tablet half  tablet p.o. b.i.d.   PERTINENT LABORATORY WORK AT THE TIME OF DISCHARGE:  White cell count  6.9, platelet count 209, hemoglobin 13.4.  Cardiac enzymes negative x3.  Total cholesterol 169, HDL 66, LDL 96.  D-dimer 0.25.  Urine pregnancy  negative.  TSH 4.141.   DISPOSITION:  The patient felt medically stable for discharge home at  this time.  The patient is instructed to follow up with her primary care  physician, Dr. Berniece Andreas on Tuesday, February 23, 2008 as previously  scheduled.      Cordelia Pen, NP      Raenette Rover. Felicity Coyer, MD  Electronically Signed  LE/MEDQ  D:  04/10/2008  T:  04/11/2008  Job:  [id number]   cc:   Neta Mends. Fabian Sharp, MD

## 2010-08-05 NOTE — Discharge Summary (Signed)
[facility] of [facility]  Patient:    Anna Poole, Anna Poole Visit Number: [id number] MRN: [id number]          Service Type: OBS Location: 910A [id number] [id number] Attending Physician:  Michaele Offer Dictated by:   Alvino Chapel, M.D. Adm. Date:  11/08/2000 Disc. Date: 11/10/2000                             Discharge Summary  DISCHARGE DIAGNOSES:          1. Term pregnancy at 39 weeks.                               2. Status post normal spontaneous vaginal                                  delivery.  HOSPITAL COURSE:              The patient is a 40 year old, G2, P1-0-0-1, who was admitted at 39 weeks with rupture of membranes at approximately midnight. She had contractions on admission and good fetal movement. On admission, she was 80% effaced, 5-6 cm dilated, and a +1 station. The patient initially had her prenatal care in [place] and transferred here at 29 weeks. She was on Aldomet her first trimester, however, this was discontinued secondary to hypotension and did not require any further medication throughout pregnancy.  PRENATAL LABORATORY DATA:     O positive, antibody negative, RPR nonreactive, rubella immune, hepatitis B surface antigen negative, HIV negative, GC negative, Chlamydia negative, GBS negative.  PAST OBSTETRICAL HISTORY:     In 1999 she had a normal spontaneous vaginal delivery at 38 weeks, 7-pound 6-ounce infant.  PAST GYNECOLOGICAL HISTORY:   She had a history of ovarian cyst with cystectomy.  PAST MEDICAL HISTORY:         Hypertension after the first delivery.  PAST SURGICAL HISTORY:        Ovarian cystectomy only.  ALLERGIES:                    No known drug allergies.  SOCIAL HISTORY:               She is married and does not smoke.  HOSPITAL COURSE:              On admission she was afebrile with stable vital signs. Fetal heart rate was reactive. The patient quickly progressed to completely dilated, completely effaced, and  +3 station. She had received an epidural anesthesia and pushed well with normal spontaneous vaginal delivery of a vigorous female infant over a first-degree laceration. The weight was 7 pounds 15 ounces. There was a nuchal cord x 1 which reduced. Apgars were 9 and 10. She then did very well postpartum and on postpartum day #2, was afebrile with stable vital signs. Her lochia was normal. Therefore, she was considered stable for discharge and was discharged with medications and followup planned.  DISCHARGE MEDICATIONS:        1. Motrin 600 mg p.o. q.6h.                               2. Percocet one to tablets p.o. every four  hours.  DISCHARGE FOLLOWUP:           The patient will follow up in six weeks for her routine postpartum  exam. Dictated by:   Alvino Chapel, M.D. Attending Physician:  Michaele Offer DD:  11/10/00 TD:  11/11/00 Job: [id number]

## 2010-08-05 NOTE — Discharge Summary (Signed)
Anna Poole, Anna Poole                        ACCOUNT NO.:  [id number]   MEDICAL RECORD NO.:  [id number]                   PATIENT TYPE:  INP   LOCATION:  9105                                 FACILITY:  [facility]   PHYSICIAN:  Huel Cote, M.D.              DATE OF BIRTH:  04-07-1970   DATE OF ADMISSION:  05/20/2002  DATE OF DISCHARGE:  05/22/2002                                 DISCHARGE SUMMARY   DISCHARGE DIAGNOSES:  1. Term pregnancy at 40 weeks, delivered.  2. Status post normal spontaneous vaginal delivery.  3. History of rapid labor.   DISCHARGE MEDICATIONS:  1. Motrin 600 mg p.o. q.6h.  2. Percocet one to two tablets p.o. q.4-6h. p.r.n.   DISCHARGE FOLLOW-UP:  The patient is to follow up in the office in  approximately six weeks for her routine postpartum exam.   HISTORY OF PRESENT ILLNESS:  The patient is a 40 year old G3 P2-0-0-2 who is  admitted at 40+ weeks with induction of labor planned given she is past her  due date and a history of rapid labor.  Her prenatal care was uncomplicated.   PRENATAL LABORATORY DATA:  O positive, antibody negative.  RPR nonreactive.  Rubella immune.  Hepatitis B surface antigen negative.  HIV declined.  GC  negative, chlamydia negative.  Group B strep negative.  One-hour Glucola  134.   PAST OBSTETRICAL HISTORY:  1. In 1999 she had a 7 pound 6 ounce infant with a five-hour labor and a     vaginal delivery.  2. In 2002 she had a 7 pound 15 ounce infant with a three-hour labor and a     vaginal delivery.   PAST GYNECOLOGICAL HISTORY:  None.   PAST MEDICAL HISTORY:  Migraines.   PAST SURGICAL HISTORY:  In 1991, a right ovarian cystectomy.   ALLERGIES:  None.   HOSPITAL COURSE:  On admission, she was afebrile with stable vital signs.  Estimated fetal weight was 7 to 7.5 pounds.  Cervix was 50, 4 and a -1  station.  She had rupture of membranes performed and was begun on Pitocin.  When the patient began to get uncomfortable she  received epidural  anesthesia, quickly reached complete dilation, and pushed well with normal  spontaneous vaginal delivery of a vigorous female infant over a small first  degree laceration.  Apgars were 9 and 9.  Weight was 7 pounds 13 ounces.  There was a nuchal cord x1 reduced over the  head.  Compound presentation of right arm was reduced posteriorly.  Placenta  delivered spontaneously.  The first degree laceration was repaired with 2-0  Vicryl for hemostasis.  On postpartum day #2 the patient was doing very  well.  She was afebrile with stable vital signs and was felt stable for  discharge home.  Huel Cote, M.D.    KR/MEDQ  D:  05/22/2002  T:  05/22/2002  Job:  [id number]

## 2010-12-20 LAB — POCT CARDIAC MARKERS
CKMB, poc: <1 — ABNORMAL LOW
Myoglobin, poc: 57.3
Troponin i, poc: <0.05

## 2010-12-20 LAB — PROTIME-INR
INR: 1
INR: 1.1
Prothrombin Time: 14

## 2010-12-20 LAB — URINALYSIS, ROUTINE W REFLEX MICROSCOPIC
Glucose, UA: NEGATIVE
Hgb urine dipstick: NEGATIVE
Protein, ur: NEGATIVE
Specific Gravity, Urine: 1.007
Urobilinogen, UA: 0.2

## 2010-12-20 LAB — DIFFERENTIAL
Basophils Absolute: 0
Basophils Relative: 1
Neutro Abs: 3.4
Neutrophils Relative %: 59

## 2010-12-20 LAB — COMPREHENSIVE METABOLIC PANEL
ALT: 14
AST: 20
Alkaline Phosphatase: 41
Alkaline Phosphatase: 42
BUN: 10
CO2: 22
CO2: 25
Chloride: 106
Chloride: 108
GFR calc Af Amer: >60
GFR calc non Af Amer: >60
Glucose, Bld: 133 — ABNORMAL HIGH
Potassium: 3.2 — ABNORMAL LOW
Potassium: 4.1
Sodium: 135
Total Bilirubin: 0.5
Total Bilirubin: 0.6

## 2010-12-20 LAB — APTT: aPTT: 34

## 2010-12-20 LAB — CBC
HCT: 40.3
Hemoglobin: 13.4
Hemoglobin: 13.8
MCHC: 34.5
RBC: 4.42
RBC: 4.57
WBC: 6.9

## 2010-12-20 LAB — CREATININE, URINE, RANDOM: Creatinine, Urine: 20.9

## 2010-12-20 LAB — POCT PREGNANCY, URINE: Preg Test, Ur: NEGATIVE

## 2010-12-20 LAB — RAPID URINE DRUG SCREEN, HOSP PERFORMED
Cocaine: NOT DETECTED
Opiates: NOT DETECTED

## 2010-12-20 LAB — CARDIAC PANEL(CRET KIN+CKTOT+MB+TROPI): Total CK: 65

## 2010-12-20 LAB — TSH: TSH: 4.141

## 2010-12-20 LAB — TROPONIN I: Troponin I: <0.01

## 2010-12-20 LAB — URINE CULTURE

## 2010-12-20 LAB — URINE MICROSCOPIC-ADD ON

## 2010-12-20 LAB — HEMOGLOBIN A1C: Mean Plasma Glucose: 103

## 2010-12-20 LAB — LIPID PANEL: Triglycerides: 35

## 2010-12-20 LAB — CK TOTAL AND CKMB (NOT AT ARMC): Relative Index: INVALID

## 2011-01-03 ENCOUNTER — Telehealth: Payer: Self-pay | Admitting: Internal Medicine

## 2011-01-03 NOTE — Telephone Encounter (Signed)
Ok to wean off the lexapro  doubt if the lisinopril is doing this unless making bp too low. Wean off the lexapro and  Lisinopril    ok to do OV  after this  Is done  ; Friday before  3 pm ( can use sda if needed) Bring in BP machine and any readings at that visit.

## 2011-01-03 NOTE — Telephone Encounter (Signed)
Spoke to pt she has been having fatigue since starting the lexapro. She has been weaning off the lexapro. She is not taking it every day. She is still taking the lisinopril. She is also having a decreased labido. She feels anxious during that time of the month.

## 2011-01-03 NOTE — Telephone Encounter (Signed)
Pt has been having problems with fatigue and lack of energy, also has gained weight. Pt feels that this is due to Lexapro and Lisinopril. Pt is req to go off both meds and change to diff ones if needed. Pt can only sch ov on Fridays after 11:30am.  [facility] [address] [facility] [address]

## 2011-01-04 NOTE — Telephone Encounter (Signed)
Pt aware of this appt scheduled.

## 2011-01-04 NOTE — Telephone Encounter (Signed)
Left message to call back  

## 2011-01-27 ENCOUNTER — Encounter: Payer: Self-pay | Admitting: Internal Medicine

## 2011-01-27 ENCOUNTER — Ambulatory Visit (INDEPENDENT_AMBULATORY_CARE_PROVIDER_SITE_OTHER): Payer: BC Managed Care – PPO | Admitting: Internal Medicine

## 2011-01-27 VITALS — BP 100/60 | HR 78 | Wt 119.0 lb

## 2011-01-27 DIAGNOSIS — T50905A Adverse effect of unspecified drugs, medicaments and biological substances, initial encounter: Secondary | ICD-10-CM

## 2011-01-27 DIAGNOSIS — F411 Generalized anxiety disorder: Secondary | ICD-10-CM

## 2011-01-27 DIAGNOSIS — G4700 Insomnia, unspecified: Secondary | ICD-10-CM

## 2011-01-27 DIAGNOSIS — T887XXA Unspecified adverse effect of drug or medicament, initial encounter: Secondary | ICD-10-CM

## 2011-01-27 DIAGNOSIS — I10 Essential (primary) hypertension: Secondary | ICD-10-CM

## 2011-01-27 DIAGNOSIS — N943 Premenstrual tension syndrome: Secondary | ICD-10-CM

## 2011-01-27 NOTE — Progress Notes (Signed)
  Subjective:    Patient ID: Anna Poole, female    DOB: May 27, 1970, 40 y.o.   MRN: [id number]  HPI Comes in for follow up of  multiple medical issues  Lexapro: weaning for anxiety :Has been of for over a week.    And doing ok.  Feels less tired. More energy someone.  Care more about things. Poss better for appetite and weight. Still has pms mood aggravations.  Thinks she may do k off meds.     BP :has been   At home  90 range  On 5 mg ace  No dizziness syncope     Migraines : Menstrual  hasn't check bp with these.  Review of Systems No fever change neuro status   No cp sob  visin changes  Gi gu other than hpi Past history family history social history reviewed in the electronic medical record.     Objective:   Physical Exam wdwn in nad Neck: Supple without adenopathy or masses or bruits Chest:  Clear to A&P without wheezes rales or rhonchi CV:  S1-S2 no gallops or murmurs peripheral perfusion is normal BP readings  Right 122/78 right   Oriented x 3. Normal cognition, attention, speech. Not anxious or depressed appearing   Good eye contact . NEURO: oriented x 3 CN 3-12 appear intact. No focal muscle weakness or atrophy. DTRs symmetrical. Gait WNL.  Grossly non focal. No tremor or abnormal movement.        Assessment & Plan:  Bp   Ok today but if  Low consider dcing med.    Anxiety and se of med fatigue.   Better   However still could have transitional  Sx  And if anxiety  Augments will readdress. menstrual migarine once a month.  frova helps.   Prevention plan  Disc med options  Will do lsi for now.

## 2011-01-27 NOTE — Patient Instructions (Signed)
Ok to stay off lexapro and adjustment period can continue another week or so.  Continue to monitor your bp if  Low most of the time.   Or dizzy we need to stop the med.  So call  About advice.

## 2011-01-28 DIAGNOSIS — T50905A Adverse effect of unspecified drugs, medicaments and biological substances, initial encounter: Secondary | ICD-10-CM | POA: Insufficient documentation

## 2011-04-10 ENCOUNTER — Telehealth: Payer: Self-pay | Admitting: Internal Medicine

## 2011-04-10 NOTE — Telephone Encounter (Signed)
Any of these can cause fatigue.   But we can try others. Can try zoloft 50 mg  Disp 30    Take 25 mg per day. For 2- weeks and then 1 - 50 mg per day. Call  Or OV in 3-4 weeks about status and med .

## 2011-04-10 NOTE — Telephone Encounter (Signed)
Left a message for pt to return call 

## 2011-04-10 NOTE — Telephone Encounter (Signed)
Pls advise.  

## 2011-04-10 NOTE — Telephone Encounter (Signed)
Been off Lexapro for 4 months. Now, she feels like that she needs to go back on it. However, prior to the 4 months, it made her feel very tired. Please advise what else that she can take for panic attacks? [facility]. Thanks.

## 2011-04-11 MED ORDER — SERTRALINE HCL 50 MG PO TABS: ORAL_TABLET | ORAL | Status: DC

## 2011-04-11 NOTE — Telephone Encounter (Signed)
Pt aware and rx sent to pharmacy. 

## 2011-04-17 ENCOUNTER — Ambulatory Visit (INDEPENDENT_AMBULATORY_CARE_PROVIDER_SITE_OTHER): Payer: BC Managed Care – PPO | Admitting: Internal Medicine

## 2011-04-17 ENCOUNTER — Encounter: Payer: Self-pay | Admitting: Internal Medicine

## 2011-04-17 VITALS — BP 100/60 | HR 86 | Temp 99.1°F | Wt 116.0 lb

## 2011-04-17 DIAGNOSIS — F411 Generalized anxiety disorder: Secondary | ICD-10-CM

## 2011-04-17 DIAGNOSIS — J029 Acute pharyngitis, unspecified: Secondary | ICD-10-CM | POA: Insufficient documentation

## 2011-04-17 DIAGNOSIS — I10 Essential (primary) hypertension: Secondary | ICD-10-CM

## 2011-04-17 NOTE — Progress Notes (Signed)
  Subjective:    Patient ID: Anna Poole, female    DOB: 1970/10/13, 41 y.o.   MRN: [id number]  HPI Patient comes in today for SDA  For acute problem evaluation. 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  No fever.   Some congestion minor cough at night.  Feels like razor blades in throat . Just wants to make sure not strep. No hx of mono in the past.  zoloft  Helping but still on 25 mg .   Had tingling in bt    Review of Systems Neg fever   As per hpi Past history family history social history reviewed in the electronic medical record. Outpatient Encounter Prescriptions as of 04/17/2011  Medication Sig Dispense Refill  . frovatriptan (FROVA) 2.5 MG tablet Take 1 by mouth twice a day 2 days before the onset of her period continues through 5 days to prevent migraine headache.  14 tablet  3  . lisinopril (PRINIVIL,ZESTRIL) 10 MG tablet Take 10 mg by mouth daily. 1/2 tab daily       . sertraline (ZOLOFT) 50 MG tablet 1/2 tab daily for 2 weeks then 1 daily  30 tablet  0       Objective:   Physical Exam WDWN in nad  HEENT:[place] eyes perrl clear conjunctiva  Ears nl tms intact  Nares slight congestion OP red 2+ no edema or lesions Neck: Supple without adenopathy or masses or bruits  Has shoddy ac pc nodes  Chest:  Clear to A&P without wheezes rales or rhonchi CV:  S1-S2 no gallops or murmurs peripheral perfusion is normal rs negative      Assessment & Plan:  Acute pharyngitis prob viral illness    Expectant management.    Anxiety /panic attack  Some response to zoloft  Inc to 50 mg and keep [place] informed .

## 2011-04-17 NOTE — Patient Instructions (Signed)
i thnik this is a viral   Infection  And  Should resolve .  On its own but cough could get worse before better.  Contact us if significant fever shortness of breath or other concerns .

## 2011-05-09 ENCOUNTER — Other Ambulatory Visit: Payer: Self-pay | Admitting: Internal Medicine

## 2011-05-09 NOTE — Telephone Encounter (Signed)
Per Dr. Panosh- ok x 1 

## 2011-05-17 ENCOUNTER — Other Ambulatory Visit: Payer: Self-pay | Admitting: Internal Medicine

## 2011-06-05 ENCOUNTER — Telehealth: Payer: Self-pay | Admitting: Internal Medicine

## 2011-06-05 NOTE — Telephone Encounter (Signed)
Left message for pt to call back  °

## 2011-06-05 NOTE — Telephone Encounter (Signed)
Pt called and said that Dr Fabian Sharp changed her Lexapro to Zoloft. Pt said that this med appeared to be working fine, until this weekend. Pt had a couple of small panic attacks in a row, and is wondering if med needs to be adjusted or changed? Req call back from nurse.

## 2011-06-06 NOTE — Telephone Encounter (Signed)
Spoke to pt- she is taking zoloft 50mg . Sat she got the feeling of having a panic attack with the chest pain and tingling in the fingers on and off since sat. She states that this happens when she feels the little thing. She is wondering if she needs to increase the medication. She was under a lot of stress last week and it was during her cycle. Zoloft 50 mg x 2 months.

## 2011-06-06 NOTE — Telephone Encounter (Signed)
Increase to 100 mg per day and  rov in 2 weeks unless having symptoms she is concerned about then can ov earlier.

## 2011-06-07 NOTE — Telephone Encounter (Signed)
Pt aware of this. 

## 2011-06-07 NOTE — Telephone Encounter (Signed)
Left message to call back  

## 2011-06-09 ENCOUNTER — Telehealth: Payer: Self-pay

## 2011-06-09 NOTE — Telephone Encounter (Signed)
Per Dr. Fabian Sharp called pt to advise to take 75 mg of Zoloft and to see when pt is coming back from [place].  Pt needs an office visit .

## 2011-06-09 NOTE — Telephone Encounter (Signed)
Pt called about the conversation she had with Carollee Herter on yesterday. Pt is in [place] at a conference.  Pt states she doubled up on the Zoloft and had a very bad panic attack to the point she felt as if she may faint.  Pt states she does not know if she needs to go back on the Lexapro and suffer from tiredness or what.  Pls advise.

## 2011-06-12 NOTE — Telephone Encounter (Signed)
Pt states she has an appt on 06/13/11 and pt is still having chest pain.  Pt states she knows the chest pains are coming from her worrying about when she will having another panic attack.  Pt states she has taken 75 mg of Zoloft today.  Pt advised to go to ER if chest pains worsens or pt has SOB along with chest pains.  Pt states she understands.

## 2011-06-13 ENCOUNTER — Ambulatory Visit (INDEPENDENT_AMBULATORY_CARE_PROVIDER_SITE_OTHER): Payer: BC Managed Care – PPO | Admitting: Internal Medicine

## 2011-06-13 ENCOUNTER — Encounter: Payer: Self-pay | Admitting: Internal Medicine

## 2011-06-13 VITALS — BP 138/86 | HR 83 | Temp 98.3°F | Wt 112.0 lb

## 2011-06-13 DIAGNOSIS — F411 Generalized anxiety disorder: Secondary | ICD-10-CM

## 2011-06-13 DIAGNOSIS — F410 Panic disorder [episodic paroxysmal anxiety] without agoraphobia: Secondary | ICD-10-CM

## 2011-06-13 DIAGNOSIS — I10 Essential (primary) hypertension: Secondary | ICD-10-CM

## 2011-06-13 HISTORY — DX: Panic disorder (episodic paroxysmal anxiety): F41.0

## 2011-06-13 MED ORDER — ALPRAZOLAM 0.25 MG PO TABS: 0.2500 mg | ORAL_TABLET | Freq: Three times a day (TID) | ORAL | Status: DC | PRN

## 2011-06-13 MED ORDER — ALPRAZOLAM 0.25 MG PO TABS: 0.2500 mg | ORAL_TABLET | Freq: Three times a day (TID) | ORAL | Status: AC | PRN

## 2011-06-13 MED ORDER — ESCITALOPRAM OXALATE 10 MG PO TABS: 10.0000 mg | ORAL_TABLET | Freq: Every day | ORAL | Status: DC

## 2011-06-13 NOTE — Progress Notes (Signed)
  Subjective:    Patient ID: Anna Poole, female    DOB: [birth year], 41 y.o.   MRN: [id number]  HPI comes in today for followup of panic attacks that she began to have out of town.while she was at a conference. We recently switched her from Lexapro which was helpful but caused some fatigue to Zoloft. She had a panic attack and included tingling in the left arm and in the right arm and feeling like she wanted to get away. She recognized this as panic and not cardiac problem. She is back in town but now very nervous that she is going to have another attack.   Here to discuss options.   Review of Systems Negative for fever or syncope neurologic symptoms blood pressure has been okay recently she is not eating as well and lost some weight because of her anxiety.  Past history family history social history reviewed in the electronic medical record.     Objective:   Physical Exam  Well-developed well-nourished in no acute distress minimally anxious. Neck: Supple without adenopathy or masses or bruits Chest:  Clear to A&P without wheezes rales or rhonchi CV:  S1-S2 no gallops or murmurs peripheral perfusion is normal Abdomen:  Sof,t normal bowel sounds without hepatosplenomegaly, no guarding rebound or masses no CVA tenderness Neuro non focal no tremor or  Oriented x 3. Normal cognition, attention, speech. Not depressed appearing   Good eye contact .  Reviewed record l     Assessment & Plan:  Anxiety with recent panic attack.  She's never been on a benzo diazepam. We'll give her a small amount of Xanax to use as needed that may help. We will switch back over to Lexapro 10 mg because it is unknown entity for her. It is possible she would do better on a different medication in the future will decide if further visits. Would consider other drug groups as needed.  She has a history of labile hypertension but it has been stable recently.

## 2011-06-13 NOTE — Patient Instructions (Signed)
This acts like panic.  Change over to Lexapro 10 mg once a day realizing there could be an adjustment period.  You can take Xanax up to 3 times a day as needed her panic. A half to one.   Would like you to come back in a month and see how you were doing call in the meantime if needed.

## 2011-06-28 ENCOUNTER — Other Ambulatory Visit: Payer: Self-pay | Admitting: Internal Medicine

## 2011-06-28 ENCOUNTER — Telehealth: Payer: Self-pay | Admitting: Internal Medicine

## 2011-06-28 NOTE — Telephone Encounter (Signed)
Pt last seen 06/13/11.  Frova last refilled 05/09/11. Pls advise.

## 2011-06-28 NOTE — Telephone Encounter (Signed)
Patient called stating that she need a refill on her Frova. Please assist.

## 2011-06-29 NOTE — Telephone Encounter (Signed)
Ok to refill x 3 

## 2011-07-03 MED ORDER — FROVATRIPTAN SUCCINATE 2.5 MG PO TABS: ORAL_TABLET | ORAL | Status: DC

## 2011-07-03 NOTE — Telephone Encounter (Signed)
Rx sent to pharmacy   

## 2011-07-03 NOTE — Telephone Encounter (Signed)
Addended by: Azucena Freed on: 07/03/2011 09:14 AM   Modules accepted: Orders

## 2011-08-15 ENCOUNTER — Other Ambulatory Visit: Payer: Self-pay | Admitting: Internal Medicine

## 2011-10-17 ENCOUNTER — Other Ambulatory Visit: Payer: Self-pay | Admitting: Internal Medicine

## 2011-10-18 ENCOUNTER — Other Ambulatory Visit: Payer: Self-pay | Admitting: Internal Medicine

## 2011-10-31 ENCOUNTER — Other Ambulatory Visit: Payer: Self-pay | Admitting: Internal Medicine

## 2011-11-01 ENCOUNTER — Telehealth: Payer: Self-pay | Admitting: Internal Medicine

## 2011-11-01 NOTE — Telephone Encounter (Signed)
Caller: Yarielis/Patient; Patient Name: Anna Poole; PCP: Madelin Headings.; Best Callback Phone Number: [phone]  10-19-11 onset of bumps which turned to blisters and now has spread after working in the yard  Is not responding to home care.  All emergent symptoms per [facility], [place] or [employer] out except for symptoms are not improving or are worsening after 3 days of home treatment.  Appointment made for 11-02-11 at 1530.

## 2011-11-02 ENCOUNTER — Encounter: Payer: Self-pay | Admitting: Family Medicine

## 2011-11-02 ENCOUNTER — Ambulatory Visit (INDEPENDENT_AMBULATORY_CARE_PROVIDER_SITE_OTHER): Payer: BC Managed Care – PPO | Admitting: Family Medicine

## 2011-11-02 VITALS — BP 122/80 | Temp 99.0°F | Wt 119.0 lb

## 2011-11-02 DIAGNOSIS — L259 Unspecified contact dermatitis, unspecified cause: Secondary | ICD-10-CM

## 2011-11-02 MED ORDER — PREDNISONE 10 MG PO TABS: ORAL_TABLET | ORAL | Status: DC

## 2011-11-02 NOTE — Progress Notes (Signed)
  Subjective:    Patient ID: Anna Poole, female    DOB: [birth year], 41 y.o.   MRN: [id number]  HPI  Acute visit. Pruritic vesicular rash arms and feet. This makes second week. First noted after working outdoors. Has tried calamine lotion without much improvement. Exacerbated by heat. No fever or chills. Nonpainful.   Review of Systems  Constitutional: Negative for fever and chills.  Skin: Positive for rash.       Objective:   Physical Exam  Constitutional: She appears well-developed and well-nourished.  Cardiovascular: Normal rate and regular rhythm.   Skin:       Patient's vesicular rash involving arms legs and forearms. No cellulitis changes. Slightly raised and vesicular. Nontender          Assessment & Plan:  Contact dermatitis. No signs of secondary infection. Prednisone taper starting at 60 mg daily. Reviewed possible side effects.  Followup as needed

## 2011-11-02 NOTE — Patient Instructions (Addendum)

## 2011-11-11 ENCOUNTER — Encounter (HOSPITAL_COMMUNITY): Payer: Self-pay

## 2011-11-11 ENCOUNTER — Emergency Department (HOSPITAL_COMMUNITY): Payer: BC Managed Care – PPO

## 2011-11-11 ENCOUNTER — Emergency Department (HOSPITAL_COMMUNITY)
Admission: EM | Admit: 2011-11-11 | Discharge: 2011-11-11 | Disposition: A | Discharge status: Home / Self Care | Payer: BC Managed Care – PPO | Attending: Emergency Medicine | Admitting: Emergency Medicine

## 2011-11-11 DIAGNOSIS — R55 Syncope and collapse: Secondary | ICD-10-CM | POA: Insufficient documentation

## 2011-11-11 DIAGNOSIS — Z79899 Other long term (current) drug therapy: Secondary | ICD-10-CM | POA: Insufficient documentation

## 2011-11-11 DIAGNOSIS — I10 Essential (primary) hypertension: Secondary | ICD-10-CM | POA: Insufficient documentation

## 2011-11-11 DIAGNOSIS — R110 Nausea: Secondary | ICD-10-CM | POA: Insufficient documentation

## 2011-11-11 DIAGNOSIS — R51 Headache: Secondary | ICD-10-CM | POA: Insufficient documentation

## 2011-11-11 HISTORY — DX: Anxiety disorder, unspecified: F41.9

## 2011-11-11 LAB — COMPREHENSIVE METABOLIC PANEL
ALT: 8 U/L (ref 0–35)
AST: 11 U/L (ref 0–37)
Alkaline Phosphatase: 33 U/L — ABNORMAL LOW (ref 39–117)
CO2: 26 mEq/L (ref 19–32)
Calcium: 8.7 mg/dL (ref 8.4–10.5)
Chloride: 102 mEq/L (ref 96–112)
GFR calc Af Amer: >90 mL/min (ref >90)
GFR calc non Af Amer: 90 mL/min — ABNORMAL LOW (ref >90)
Glucose, Bld: 103 mg/dL — ABNORMAL HIGH (ref 70–99)
Potassium: 3.6 mEq/L (ref 3.5–5.1)
Total Bilirubin: 0.2 mg/dL — ABNORMAL LOW (ref 0.3–1.2)

## 2011-11-11 LAB — CBC
MCH: 29.6 pg (ref 26.0–34.0)
MCHC: 34.5 g/dL (ref 30.0–36.0)
RDW: 13 % (ref 11.5–15.5)

## 2011-11-11 LAB — URINALYSIS, ROUTINE W REFLEX MICROSCOPIC
Ketones, ur: NEGATIVE mg/dL
Nitrite: NEGATIVE
Specific Gravity, Urine: 1.031 — ABNORMAL HIGH (ref 1.005–1.030)
Urobilinogen, UA: 0.2 mg/dL (ref 0.0–1.0)

## 2011-11-11 LAB — GLUCOSE, CAPILLARY: Glucose-Capillary: 94 mg/dL (ref 70–99)

## 2011-11-11 LAB — TROPONIN I: Troponin I: <0.3 ng/mL (ref <0.30)

## 2011-11-11 MED ORDER — SODIUM CHLORIDE 0.9 % IV SOLN
Freq: Once | INTRAVENOUS | Status: AC
  Administered 2011-11-11: 20:00:00 via INTRAVENOUS

## 2011-11-11 MED ORDER — ONDANSETRON 4 MG PO TBDP
4.0000 mg | ORAL_TABLET | Freq: Once | ORAL | Status: AC
  Administered 2011-11-11: 4 mg via ORAL
  Filled 2011-11-11: qty 1

## 2011-11-11 MED ORDER — ONDANSETRON HCL 4 MG PO TABS: 4.0000 mg | ORAL_TABLET | Freq: Four times a day (QID) | ORAL | Status: AC

## 2011-11-11 MED ORDER — HYDROCODONE-ACETAMINOPHEN 5-325 MG PO TABS
1.0000 | ORAL_TABLET | Freq: Once | ORAL | Status: AC
  Administered 2011-11-11: 1 via ORAL
  Filled 2011-11-11: qty 1

## 2011-11-11 MED ORDER — ONDANSETRON HCL 4 MG/2ML IJ SOLN
INTRAMUSCULAR | Status: AC
  Administered 2011-11-11: 20:00:00
  Filled 2011-11-11: qty 2

## 2011-11-11 MED ORDER — SODIUM CHLORIDE 0.9 % IV BOLUS (SEPSIS)
1000.0000 mL | Freq: Once | INTRAVENOUS | Status: AC
  Administered 2011-11-11: 1000 mL via INTRAVENOUS

## 2011-11-11 NOTE — ED Provider Notes (Signed)
Medical screening examination/treatment/procedure(s) were conducted as a shared visit with non-physician practitioner(s) and myself.  I personally evaluated the patient during the encounter  Pt in no distress.  Has history of near syncope.  Pt was out in the sun a lot today and is on a recent course of oral steroids (accounting for her leukocytosis).  Low risk for cardiac dysrhythmia.  Pt and husband are comfortable with discharge and follow up.  Celene Kras, MD 11/11/11 2230

## 2011-11-11 NOTE — ED Notes (Signed)
Per [facility]- Pt presents with no acute distress. Reports syncopal episode witnessed by Husband.  Husband reports pt was at pool all day.  Pt vomited on scene.  #20 gauge LAC- zofran 4mg  and 250cc NS  GCS 15

## 2011-11-11 NOTE — ED Notes (Signed)
ZOX:WRUE<AV> Expected date:11/11/11<BR> Expected time: 6:59 PM<BR> Means of arrival:Ambulance<BR> Comments:<BR> Syncopal episode

## 2011-11-11 NOTE — ED Notes (Signed)
MD at bedside. 

## 2011-11-11 NOTE — ED Provider Notes (Signed)
History     CSN: [id number]  Arrival date & time 11/11/11  1903   First MD Initiated Contact with Patient 11/11/11 2008      Chief Complaint  Patient presents with  . Loss of Consciousness  . Nausea    (Consider location/radiation/quality/duration/timing/severity/associated sxs/prior treatment) HPI  Pt presents to the ED by EMS for syncopal episode. He states that she was at the pool today and spent 5 hours in direct sunlight. She admits that she drank plenty of water but developed a headache and felt lightheaded which she got home. While at dinner with her husband she states that she got nauseous and started to feel woozy. When she started to walk to the car she wobbled some and then had a syncopal episode that lasted 20 seconds per husband. Aside from nausea and headache, she denies any other associated symptoms of SOB, CP, weakness (focal or generalized). She denies a history of frequent syncopal episodes. At triage her vital signs are stable, the patient is awake alert and oriented and in no acute distress at this time.  Past Medical History  Diagnosis Date  . Heart murmur     had neg echo and ekg  . Headache   . Preeclampsia     ht in pregnancy   . Hypertension     Hosp 11/09 and K 3.2 neg eval, nl echo and neg aldopra ratio  . Palpitations   . History of miscarriage 2009  . Anxiety     Past Surgical History  Procedure Date  . Ovarian cyst removal 1991    [facility]    Family History  Problem Relation Age of Onset  . Hypertension Mother   . Diabetes type II      ggf  . Stroke      pgf     History  Substance Use Topics  . Smoking status: Never Smoker   . Smokeless tobacco: Not on file  . Alcohol Use: Yes    OB History    Grav Para Term Preterm Abortions TAB SAB Ect Mult Living   4 3             Obstetric Comments   Miscarriage 2009      Review of Systems  HEENT: denies blurry vision or change in hearing PULMONARY: Denies difficulty breathing and  SOB CARDIAC: denies chest pain or heart palpitations MUSCULOSKELETAL:  denies being unable to ambulate ABDOMEN AL: denies abdominal pain GU: denies loss of bowel or urinary control NEURO: denies numbness and tingling in extremities SKIN: no new rashes PSYCH: patient denies anxiety or depression. NECK: Pt denies having neck pain    Allergies  Review of patient's allergies indicates no known allergies.  Home Medications   Current Outpatient Rx  Name Route Sig Dispense Refill  . ESCITALOPRAM OXALATE 10 MG PO TABS      . FROVATRIPTAN SUCCINATE 2.5 MG PO TABS Oral Take 2.5 mg by mouth as needed. If recurs, may repeat after 2 hours. Max of 3 tabs in 24 hours. Migraines    . GENERESS FE 0.8-25 MG-MCG PO CHEW Oral Chew 1 tablet by mouth daily.     Marland Kitchen LISINOPRIL 10 MG PO TABS  1/2 tab daily    . PREDNISONE 10 MG PO TABS  10 mg daily. Taper as follows: 6-5-4-4-4-3-3-2-2-1-1      BP 100/64  Pulse 81  Temp 97.6 F (36.4 C) (Oral)  Resp 16  SpO2 100%  LMP 11/04/2011  Physical Exam  Nursing note and vitals reviewed. Constitutional: She appears well-developed and well-nourished. No distress.  HENT:  Head: Normocephalic and atraumatic.  Eyes: Pupils are equal, round, and reactive to light.  Neck: Normal range of motion. Neck supple.  Cardiovascular: Normal rate and regular rhythm.   Pulmonary/Chest: Effort normal.  Abdominal: Soft.  Neurological: She is alert. She has normal strength. No sensory deficit. She displays a negative Romberg sign. GCS eye subscore is 4. GCS verbal subscore is 5. GCS motor subscore is 6.  Skin: Skin is warm and dry.    ED Course  Procedures (including critical care time)  Labs Reviewed  URINALYSIS, ROUTINE W REFLEX MICROSCOPIC - Abnormal; Notable for the following:    APPearance CLOUDY (*)     Specific Gravity, Urine 1.031 (*)     Bilirubin Urine SMALL (*)     Leukocytes, UA TRACE (*)     All other components within normal limits  COMPREHENSIVE  METABOLIC PANEL - Abnormal; Notable for the following:    Glucose, Bld 103 (*)     Albumin 3.3 (*)     Alkaline Phosphatase 33 (*)     Total Bilirubin 0.2 (*)     GFR calc non Af Amer 90 (*)     All other components within normal limits  CBC - Abnormal; Notable for the following:    WBC 21.2 (*)     All other components within normal limits  URINE MICROSCOPIC-ADD ON - Abnormal; Notable for the following:    Squamous Epithelial / LPF FEW (*)     Bacteria, UA FEW (*)     Crystals CA OXALATE CRYSTALS (*)     All other components within normal limits  GLUCOSE, CAPILLARY  TROPONIN I  POCT PREGNANCY, URINE   Dg Chest 2 View  11/11/2011  *RADIOLOGY REPORT*  Clinical Data: 41 year old female with syncope.  CHEST - 2 VIEW  Comparison: 02/11/2008.  Findings: Stable and normal lung volumes.  Cardiac size and mediastinal contours are within normal limits.  Visualized tracheal air column is within normal limits.  Mildly increased interstitial markings diffusely, similar prior.  No pneumothorax, pulmonary edema, pleural effusion or acute pulmonary opacity.  Mild chronic apical scarring. No acute osseous abnormality identified.  IMPRESSION: No acute cardiopulmonary abnormality.   Original Report Authenticated By: Harley Hallmark, M.D.      1. Syncope       MDM   Date: 11/11/2011  Rate: 83  Rhythm: normal sinus rhythm  QRS Axis: normal  Intervals: normal  ST/T Wave abnormalities: normal  Conduction Disutrbances:none  Narrative Interpretation:   Old EKG Reviewed: unchanged from Feb 16, 2008    Patient workup is benign. I have noted that patient has elevated white count of 16,000. The patient is currently on prednisone for poison ivy. Dr. Lynelle Doctor and I have reviewed the patient's case and we both feel that she is safe to discharge at this time. I believe that the patient most likely had an extra of anxiety and too much sun today. The patient has a primary care doctor and has been asked to call  Monday. She's been given strict return to the emergency department guidelines. She has voiced her understanding. Prescription of Zofran given for nausea.        Dorthula Matas, PA 11/11/11 2221

## 2011-11-11 NOTE — ED Notes (Signed)
Patient is resting comfortably. 

## 2011-11-11 NOTE — ED Notes (Signed)
Family at bedside. 

## 2011-11-22 ENCOUNTER — Other Ambulatory Visit: Payer: Self-pay | Admitting: Internal Medicine

## 2011-11-29 ENCOUNTER — Other Ambulatory Visit: Payer: Self-pay | Admitting: Family Medicine

## 2011-11-29 MED ORDER — ALPRAZOLAM 0.25 MG PO TABS: 0.2500 mg | ORAL_TABLET | Freq: Three times a day (TID) | ORAL | Status: AC | PRN

## 2011-11-29 NOTE — Telephone Encounter (Signed)
Called to the pharmacy and left on voicemail. 

## 2011-11-29 NOTE — Telephone Encounter (Signed)
Pt is requesting refills.  Last seen 06/13/11 and has CPE on 02/06/12.  Last filled (per pharmacy) was 06/13/11.  Please advise.  Thanks!!!

## 2011-11-29 NOTE — Telephone Encounter (Signed)
Ok to refill x 1 #20 

## 2011-12-05 ENCOUNTER — Other Ambulatory Visit: Payer: Self-pay | Admitting: Internal Medicine

## 2012-01-07 ENCOUNTER — Other Ambulatory Visit: Payer: Self-pay | Admitting: Internal Medicine

## 2012-01-22 ENCOUNTER — Telehealth: Payer: Self-pay | Admitting: Family Medicine

## 2012-01-22 NOTE — Telephone Encounter (Signed)
Received a statement from [facility] that this rx will not be covered by her insurance because the pt is not having 4 or more headaches per month.  Would you like to prescribe a new rx?  Please advise.  Thanks!!

## 2012-01-23 NOTE — Telephone Encounter (Signed)
Left message for the pt to return my call on home phone.  Unable to leave message on cell.  Voicemail has not been set up.

## 2012-01-23 NOTE — Telephone Encounter (Signed)
This is for menstrual migraine  Management  Contact pt and ask if other meds have helped in the past that her insurance will pay for.  She may need to contact them for formulary  Choices.

## 2012-01-24 NOTE — Telephone Encounter (Signed)
Called and informed the pt.  She has appt with WP on 02/06/12.  She will discuss a new medication at that time.  Has enough samples to last until then.

## 2012-01-26 ENCOUNTER — Other Ambulatory Visit (INDEPENDENT_AMBULATORY_CARE_PROVIDER_SITE_OTHER): Payer: BC Managed Care – PPO

## 2012-01-26 ENCOUNTER — Telehealth: Payer: Self-pay | Admitting: Family Medicine

## 2012-01-26 ENCOUNTER — Other Ambulatory Visit: Payer: Self-pay | Admitting: Family Medicine

## 2012-01-26 DIAGNOSIS — Z0000 Encounter for general adult medical examination without abnormal findings: Secondary | ICD-10-CM

## 2012-01-26 LAB — BASIC METABOLIC PANEL
BUN: 11 mg/dL (ref 6–23)
Chloride: 104 mEq/L (ref 96–112)
Creatinine, Ser: 0.8 mg/dL (ref 0.4–1.2)
Glucose, Bld: 78 mg/dL (ref 70–99)
Potassium: 3.7 mEq/L (ref 3.5–5.1)

## 2012-01-26 LAB — CBC WITH DIFFERENTIAL/PLATELET
Basophils Relative: 0.4 % (ref 0.0–3.0)
Eosinophils Absolute: 0 10*3/uL (ref 0.0–0.7)
Eosinophils Relative: 0.5 % (ref 0.0–5.0)
Hemoglobin: 12.9 g/dL (ref 12.0–15.0)
Lymphocytes Relative: 29.7 % (ref 12.0–46.0)
MCHC: 33.1 g/dL (ref 30.0–36.0)
MCV: 87.8 fl (ref 78.0–100.0)
Monocytes Absolute: 0.4 10*3/uL (ref 0.1–1.0)
Neutro Abs: 3.4 10*3/uL (ref 1.4–7.7)
Neutrophils Relative %: 62 % (ref 43.0–77.0)
RBC: 4.43 Mil/uL (ref 3.87–5.11)
WBC: 5.5 10*3/uL (ref 4.5–10.5)

## 2012-01-26 LAB — POCT URINALYSIS DIPSTICK
Bilirubin, UA: NEGATIVE
Glucose, UA: NEGATIVE
Nitrite, UA: NEGATIVE
Urobilinogen, UA: 0.2

## 2012-01-26 LAB — LDL CHOLESTEROL, DIRECT: Direct LDL: 145.7 mg/dL

## 2012-01-26 LAB — LIPID PANEL
Cholesterol: 221 mg/dL — ABNORMAL HIGH (ref 0–200)
Total CHOL/HDL Ratio: 4
VLDL: 24.6 mg/dL (ref 0.0–40.0)

## 2012-01-26 LAB — HEPATIC FUNCTION PANEL
Alkaline Phosphatase: 33 U/L — ABNORMAL LOW (ref 39–117)
Bilirubin, Direct: 0.1 mg/dL (ref 0.0–0.3)
Total Bilirubin: 0.4 mg/dL (ref 0.3–1.2)

## 2012-01-26 MED ORDER — FROVATRIPTAN SUCCINATE 2.5 MG PO TABS: ORAL_TABLET | ORAL | Status: DC

## 2012-01-26 MED ORDER — FROVATRIPTAN SUCCINATE 2.5 MG PO TABS: 2.5000 mg | ORAL_TABLET | ORAL | Status: DC | PRN

## 2012-01-26 NOTE — Telephone Encounter (Signed)
Sent to [facility] on [address]

## 2012-01-26 NOTE — Telephone Encounter (Signed)
[facility] will only approve 12 tablets of Frova per month for this pt. Rx was written for 14 & was denied. [facility] uses [facility] [address]. Thank you.

## 2012-01-26 NOTE — Telephone Encounter (Signed)
3 month supply sent to [facility]   #36.

## 2012-01-27 ENCOUNTER — Other Ambulatory Visit: Payer: Self-pay | Admitting: Internal Medicine

## 2012-02-01 ENCOUNTER — Telehealth: Payer: Self-pay | Admitting: Internal Medicine

## 2012-02-01 NOTE — Telephone Encounter (Signed)
Pt called and said that [facility] [address] have tried for 4 days to get pts refill of escitalopram (LEXAPRO) 10 MG tablet 1 pill a day, but have not rcvd a response. Pls call [facility] [address]. Pt said that she has been completely out of med.

## 2012-02-02 NOTE — Telephone Encounter (Signed)
Pt called to check on status of getting Lexapro call in asap today. [facility] [address].

## 2012-02-02 NOTE — Telephone Encounter (Signed)
Done

## 2012-02-02 NOTE — Telephone Encounter (Signed)
This is to be refilled  Per other request ? Make sure goes through to pharmacy.

## 2012-02-02 NOTE — Telephone Encounter (Signed)
LEXAPRO last filled on 9/4 qty 30 with 1 refill. Pt last seen by Dr. Fabian Sharp on 06/13/11. Dr. Caryl Never seen on 8/15 for contact dermatitis. Ok to refill?

## 2012-02-06 ENCOUNTER — Ambulatory Visit (INDEPENDENT_AMBULATORY_CARE_PROVIDER_SITE_OTHER): Payer: BC Managed Care – PPO | Admitting: Internal Medicine

## 2012-02-06 ENCOUNTER — Encounter: Payer: Self-pay | Admitting: Internal Medicine

## 2012-02-06 VITALS — BP 118/80 | HR 98 | Temp 98.6°F | Ht 63.0 in | Wt 119.0 lb

## 2012-02-06 DIAGNOSIS — F411 Generalized anxiety disorder: Secondary | ICD-10-CM

## 2012-02-06 DIAGNOSIS — R12 Heartburn: Secondary | ICD-10-CM

## 2012-02-06 DIAGNOSIS — M79603 Pain in arm, unspecified: Secondary | ICD-10-CM

## 2012-02-06 DIAGNOSIS — M79609 Pain in unspecified limb: Secondary | ICD-10-CM

## 2012-02-06 DIAGNOSIS — I10 Essential (primary) hypertension: Secondary | ICD-10-CM

## 2012-02-06 DIAGNOSIS — Z87898 Personal history of other specified conditions: Secondary | ICD-10-CM

## 2012-02-06 DIAGNOSIS — Z0000 Encounter for general adult medical examination without abnormal findings: Secondary | ICD-10-CM

## 2012-02-06 DIAGNOSIS — Z9189 Other specified personal risk factors, not elsewhere classified: Secondary | ICD-10-CM

## 2012-02-06 DIAGNOSIS — N943 Premenstrual tension syndrome: Secondary | ICD-10-CM

## 2012-02-06 MED ORDER — RIZATRIPTAN BENZOATE 10 MG PO TBDP: 10.0000 mg | ORAL_TABLET | ORAL | Status: DC | PRN

## 2012-02-06 NOTE — Patient Instructions (Addendum)
Can try   lexapro 5 alternating  With 10 mg  Or so.  To see if can still get benefit  From the lexapro. AND minimize the side effects.  Will give you a rx for a different    Migraine  maxalt prn migraine. Contact us for refill of medication Lipid level could be better . I suspect the arm sx are related to positional compression of nerves  With your job. contac tus for fu if   progressive  Or any weakness. Reflux can cause cp at night .  Diet for Gastroesophageal Reflux Disease, Adult Reflux (acid reflux) is when acid from your stomach flows up into the esophagus. When acid comes in contact with the esophagus, the acid causes irritation and soreness (inflammation) in the esophagus. When reflux happens often or so severely that it causes damage to the esophagus, it is called gastroesophageal reflux disease (GERD). Nutrition therapy can help ease the discomfort of GERD. FOODS OR DRINKS TO AVOID OR LIMIT  Smoking or chewing tobacco. Nicotine is one of the most potent stimulants to acid production in the gastrointestinal tract.  Caffeinated and decaffeinated coffee and black tea.  Regular or low-calorie carbonated beverages or energy drinks (caffeine-free carbonated beverages are allowed).   Strong spices, such as black pepper, white pepper, red pepper, cayenne, curry powder, and chili powder.  Peppermint or spearmint.  Chocolate.  High-fat foods, including meats and fried foods. Extra added fats including oils, butter, salad dressings, and nuts. Limit these to less than 8 tsp per day.  Fruits and vegetables if they are not tolerated, such as citrus fruits or tomatoes.  Alcohol.  Any food that seems to aggravate your condition. If you have questions regarding your diet, call your caregiver or a registered dietitian. OTHER THINGS THAT MAY HELP GERD INCLUDE:   Eating your meals slowly, in a relaxed setting.  Eating 5 to 6 small meals per day instead of 3 large meals.  Eliminating food  for a period of time if it causes distress.  Not lying down until 3 hours after eating a meal.  Keeping the head of your bed raised 6 to 9 inches (15 to 23 cm) by using a foam wedge or blocks under the legs of the bed. Lying flat may make symptoms worse.  Being physically active. Weight loss may be helpful in reducing reflux in overweight or obese adults.  Wear loose fitting clothing EXAMPLE MEAL PLAN This meal plan is approximately 2,000 calories based on https://www.bernard.org/ meal planning guidelines. Breakfast   cup cooked oatmeal.  1 cup strawberries.  1 cup low-fat milk.  1 oz almonds. Snack  1 cup cucumber slices.  6 oz yogurt (made from low-fat or fat-free milk). Lunch  2 slice whole-wheat bread.  2 oz sliced [place].  2 tsp mayonnaise.  1 cup blueberries.  1 cup snap peas. Snack  6 whole-wheat crackers.  1 oz string cheese. Dinner   cup brown rice.  1 cup mixed veggies.  1 tsp olive oil.  3 oz grilled fish. Document Released: 03/06/2005 Document Revised: 05/29/2011 Document Reviewed: 01/20/2011 [facility] Patient Information 2013 [place], [place].

## 2012-02-06 NOTE — Progress Notes (Signed)
Chief Complaint  Patient presents with  . Annual Exam    No pap    HPI: Patient comes in today for Preventive Health Care visit  Also fu of medical management HAs anxiety etc.  Headaches :  better now on ocps  More continuous and  Bite guard   Not having many periods.  It has been awhile since needed [employer] may not cover this but imitrex of maxalt used some in the past and willing to try this if needed  .   Anxiety:  lexapro   Ran out for 4-5 days and felt  More energetic but more anxious. And tingling  Some se se noted  Fainted at the pool this summer felt to be from dehydration and over heating . Was on low dose lisinopril  BP  Good on low dose   3 weeks ago had dizzy spell with head  Motion and lke vertigo.  / if like a pnic  For 3 days.   Some arm pains for a year.  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 GI /GU: No adominal pain, vomiting, change in bowel habits.  Has some hb esp at night   No vomiting weight loss No blood in the stool. No significant GU symptoms. SKIN/HEME: ,no acute skin rashes suspicious lesions or bleeding. No lymphadenopathy, nodules, masses.  NEURO/ PSYCH:  No neurologic signs such as weakness numbness. No depression  IMM/ Allergy: No unusual infections.  Allergy .   REST of 12 system review negative except as per HPI   Past Medical History  Diagnosis Date  . Heart murmur     had neg echo and ekg  . Headache   . Preeclampsia     ht in pregnancy   . Hypertension     Hosp 11/09 and K 3.2 neg eval, nl echo and neg aldopra ratio  . Palpitations   . History of miscarriage 2009  . Anxiety   . Panic attack 06/13/2011  . Hyperkalemia 07/08/2010  . HYPOKALEMIA,  HX OF 02/18/2008    Qualifier: Diagnosis of  By: Fabian Sharp MD, Neta Mends   . Hx of syncope     summer poss heat and hydration  . IRREGULAR MENSES 09/06/2007    Qualifier: Diagnosis of  By: Lawernce Ion, CMA (AAMA), Bethann Berkshire CARDIAC MURMUR 07/12/2007    Qualifier: Diagnosis of  By: Lawernce Ion, CMA (AAMA), Bethann Berkshire     Family History  Problem Relation Age of Onset  . Hypertension Mother   . Diabetes type II      ggf  . Stroke      pgf     History   Social History  . Marital Status: Married    Spouse Name: N/A    Number of Children: N/A  . Years of Education: N/A   Social History Main Topics  . Smoking status: Never Smoker   . Smokeless tobacco: None  . Alcohol Use: Yes  . Drug Use: No  . Sexually Active:    Other Topics Concern  . 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 escitalopram (LEXAPRO) 10 MG tablet       . escitalopram (LEXAPRO) 10 MG tablet TAKE 1 TABLET BY MOUTH EVERY DAY  30 tablet  1  . escitalopram (LEXAPRO) 10 MG tablet TAKE 1 TABLET BY MOUTH EVERY DAY  30 tablet  5  . GENERESS FE 0.8-25 MG-MCG tablet Chew 1 tablet by mouth daily.       Marland Kitchen lisinopril (PRINIVIL,ZESTRIL) 10 MG tablet 1/2 tab daily      . FROVA 2.5 MG tablet 1 TAB TWICE A DAY FOR 2 DAYS BEFORE THE ONSET OF PERIOD CONTINUE THROUGH 5 DAYS TO PREVENT MIGRAINE  14 tablet  1  . frovatriptan (FROVA) 2.5 MG tablet If recurs, may repeat after 2 hours. Max of 3 tabs in 24 hours.  36 tablet  0  . predniSONE (DELTASONE) 10 MG tablet 10 mg daily. Taper as follows: 6-5-4-4-4-3-3-2-2-1-1         EXAM:  BP 118/80  Pulse 98  Temp 98.6 F (37 C) (Oral)  Ht 5\' 3"  (1.6 m)  Wt 119 lb (53.978 kg)  BMI 21.08 kg/m2  SpO2 99%  Body mass index is 21.08 kg/(m^2).  Physical Exam: Vital signs  reviewed ZOX:WRUE is a well-developed well-nourished alert cooperative   female who appears her stated age in no acute distress.  HEENT: normocephalic atraumatic , Eyes: PERRL EOM's full, conjunctiva clear, Nares: paten,t no deformity discharge or tenderness., Ears: no deformity EAC's clear TMs with normal landmarks. Mouth: clear OP, no lesions, edema.  Moist mucous membranes. Dentition in adequate repair. NECK: supple without masses, thyromegaly or bruits. CHEST/PULM:  Clear to auscultation and percussion breath sounds equal no wheeze , rales or rhonchi. No chest wall deformities or tenderness. CV: PMI is nondisplaced, S1 S2 no gallops, murmurs, rubs. Peripheral pulses are full without delay.No JVD .  Breast: normal by inspection . No dimpling, discharge, masses, tenderness or discharge . ABDOMEN: Bowel sounds normal nontender  No guard or rebound, no hepato splenomegal no CVA tenderness.  No hernia. Extremtities:  No clubbing cyanosis or edema, no acute joint swelling or redness no focal atrophy NEURO:  Oriented x3, cranial nerves 3-12 appear to be intact, no obvious focal weakness,gait within normal limits no abnormal reflexes or asymmetrical SKIN: No acute rashes normal turgor, color, no bruising or petechiae. PSYCH: Oriented, good eye contact, no obvious depression minimal  anxiety, cognition and judgment appear normal. LN: no cervical axillary inguinal adenopathy  Lab Results  Component Value Date   WBC 5.5 01/26/2012   HGB 12.9 01/26/2012   HCT 38.9 01/26/2012   PLT 227.0 01/26/2012   GLUCOSE 78 01/26/2012   CHOL 221* 01/26/2012   TRIG 123.0 01/26/2012   HDL 51.40 01/26/2012   LDLDIRECT 145.7 01/26/2012   LDLCALC 103* 03/05/2009   ALT 13 01/26/2012   AST 18 01/26/2012   NA 139 01/26/2012   K 3.7 01/26/2012   CL 104 01/26/2012   CREATININE 0.8 01/26/2012   BUN 11 01/26/2012   CO2 26 01/26/2012   TSH 1.50 01/26/2012   INR 1.1 02/12/2008   HGBA1C  Value: 5.2 (NOTE)   The ADA recommends the  following therapeutic goal for glycemic   control related to Hgb A1C measurement:   Goal of Therapy:   < 7.0% Hgb A1C   Reference: American Diabetes Association: Clinical Practice   Recommendations 2008, Diabetes Care,  2008, 31:(Suppl  1). 02/12/2008    ASSESSMENT AND PLAN:  Discussed the following assessment and plan:  1. Preventative health care    declined flu vaccine  2. ANXIETY   3. MIGRAINE, MENSTRUAL    better on ocps rx  change triptan insurance reasons  4. LABILE HYPERTENSION   5. Hx of syncope   6. chest pain nocturnal poss HB    nocturnal lsi and meds if needed  7. Arm pain      Patient Instructions  Can try   lexapro 5 alternating  With 10 mg  Or so.  To see if can still get benefit  From the lexapro. AND minimize the side effects.  Will give you a rx for a different    Migraine  maxalt prn migraine. Contact us for refill of medication Lipid level could be better . I suspect the arm sx are related to positional compression of nerves  With your job. contac tus for fu if   progressive  Or any weakness. Reflux can cause cp at night .  Diet for Gastroesophageal Reflux Disease, Adult Reflux (acid reflux) is when acid from your stomach flows up into the esophagus. When acid comes in contact with the esophagus, the acid causes irritation and soreness (inflammation) in the esophagus. When reflux happens often or so severely that it causes damage to the esophagus, it is called gastroesophageal reflux disease (GERD). Nutrition therapy can help ease the discomfort of GERD. FOODS OR DRINKS TO AVOID OR LIMIT  Smoking or chewing tobacco. Nicotine is one of the most potent stimulants to acid production in the gastrointestinal tract.  Caffeinated and decaffeinated coffee and black tea.  Regular or low-calorie carbonated beverages or energy drinks (caffeine-free carbonated beverages are allowed).   Strong spices, such as black pepper, white pepper, red pepper, cayenne, curry powder,  and chili powder.  Peppermint or spearmint.  Chocolate.  High-fat foods, including meats and fried foods. Extra added fats including oils, butter, salad dressings, and nuts. Limit these to less than 8 tsp per day.  Fruits and vegetables if they are not tolerated, such as citrus fruits or tomatoes.  Alcohol.  Any food that seems to aggravate your condition. If you have questions regarding your diet, call your caregiver or a registered dietitian. OTHER THINGS THAT MAY HELP GERD INCLUDE:   Eating your meals slowly, in a relaxed setting.  Eating 5 to 6 small meals per day instead of 3 large meals.  Eliminating food for a period of time if it causes distress.  Not lying down until 3 hours after eating a meal.  Keeping the head of your bed raised 6 to 9 inches (15 to 23 cm) by using a foam wedge or blocks under the legs of the bed. Lying flat may make symptoms worse.  Being physically active. Weight loss may be helpful in reducing reflux in overweight or obese adults.  Wear loose fitting clothing EXAMPLE MEAL PLAN This meal plan is approximately 2,000 calories based on https://www.bernard.org/ meal planning guidelines. Breakfast   cup cooked oatmeal.  1 cup strawberries.  1 cup low-fat milk.  1 oz almonds. Snack  1 cup cucumber slices.  6 oz yogurt (made from low-fat or fat-free milk). Lunch  2 slice whole-wheat bread.  2 oz sliced [place].  2 tsp mayonnaise.  1 cup blueberries.  1 cup snap peas. Snack  6 whole-wheat crackers.  1 oz string cheese. Dinner   cup brown rice.  1 cup mixed veggies.  1 tsp  olive oil.  3 oz grilled fish. Document Released: 03/06/2005 Document Revised: 05/29/2011 Document Reviewed: 01/20/2011 [facility] Patient Information 2013 [place], [place].      Neta Mends. Panosh M.D.

## 2012-02-10 ENCOUNTER — Encounter: Payer: Self-pay | Admitting: Internal Medicine

## 2012-02-10 DIAGNOSIS — M79603 Pain in arm, unspecified: Secondary | ICD-10-CM | POA: Insufficient documentation

## 2012-02-10 DIAGNOSIS — R12 Heartburn: Secondary | ICD-10-CM | POA: Insufficient documentation

## 2012-02-10 DIAGNOSIS — Z87898 Personal history of other specified conditions: Secondary | ICD-10-CM | POA: Insufficient documentation

## 2012-03-25 ENCOUNTER — Other Ambulatory Visit: Payer: Self-pay | Admitting: Internal Medicine

## 2012-04-02 ENCOUNTER — Encounter: Payer: Self-pay | Admitting: Internal Medicine

## 2012-04-02 ENCOUNTER — Ambulatory Visit (INDEPENDENT_AMBULATORY_CARE_PROVIDER_SITE_OTHER): Payer: BC Managed Care – PPO | Admitting: Internal Medicine

## 2012-04-02 VITALS — BP 130/84 | HR 108 | Temp 98.9°F | Wt 115.0 lb

## 2012-04-02 DIAGNOSIS — J22 Unspecified acute lower respiratory infection: Secondary | ICD-10-CM

## 2012-04-02 DIAGNOSIS — H9201 Otalgia, right ear: Secondary | ICD-10-CM

## 2012-04-02 DIAGNOSIS — H9209 Otalgia, unspecified ear: Secondary | ICD-10-CM

## 2012-04-02 DIAGNOSIS — J988 Other specified respiratory disorders: Secondary | ICD-10-CM

## 2012-04-02 NOTE — Patient Instructions (Addendum)
Lungs are clear and there is no ear infection . This is  An acute viral infections withg eustachian tube dysfunction and some radiation of pain from throat and neck.  Expect fever to be gone in another day or of 2 .  Symptomatic  Relief  There are no curative medications. meds for comfort . Rest and fluids.   Contact us if alarm symptoms of relapsing persistent fever  Shortness of breath  Or sever local pain etc.   No work until fever gone   Call if want hydrocodone cough med for comfort

## 2012-04-02 NOTE — Progress Notes (Signed)
Chief Complaint  Patient presents with  . Cough    Cough was productive but has stopped.  Sx started on Sunday.  She has been treating with Mucinex.  Rt ear is starting to hurt.  . Nasal Congestion  . Generalized Body Aches  . Fever  . Chills  . Otalgia    HPI: Patient comes in today for SDA for  new problem evaluation. Onset 2 days ago nad now right ear issues. Can hear.    Had 101 fever and used advil.   Used some mucinex and sudafed once  No vomiting no chest sob but spasm  Mostly dry cough . Mom had similar sx recently   Went to funeral .  ROS: See pertinent positives and negatives per HPI.  Past Medical History  Diagnosis Date  . Heart murmur     had neg echo and ekg  . Headache   . Preeclampsia     ht in pregnancy   . Hypertension     Hosp 11/09 and K 3.2 neg eval, nl echo and neg aldopra ratio  . Palpitations   . History of miscarriage 2009  . Anxiety   . Panic attack 06/13/2011  . Hyperkalemia 07/08/2010  . HYPOKALEMIA, HX OF 02/18/2008    Qualifier: Diagnosis of  By: Fabian Sharp MD, Neta Mends   . Hx of syncope     summer poss heat and hydration  . IRREGULAR MENSES 09/06/2007    Qualifier: Diagnosis of  By: Lawernce Ion, CMA (AAMA), Bethann Berkshire CARDIAC MURMUR 07/12/2007    Qualifier: Diagnosis of  By: Lawernce Ion, CMA (AAMA), Bethann Berkshire     Family History  Problem Relation Age of Onset  . Hypertension Mother   . Diabetes type II      ggf  . Stroke      pgf     History   Social History  . Marital Status: Married    Spouse Name: N/A    Number of Children: N/A  . Years of Education: N/A   Social History Main Topics  . Smoking status: Never Smoker   . Smokeless tobacco: None  . Alcohol Use: Yes  . Drug Use: No  . Sexually Active:    Other Topics Concern  . 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 escitalopram (LEXAPRO) 10 MG tablet TAKE 1 TABLET BY MOUTH EVERY DAY  30 tablet  1  . escitalopram (LEXAPRO) 10 MG tablet TAKE 1 TABLET BY MOUTH EVERY DAY  30 tablet  5  . GENERESS FE 0.8-25 MG-MCG tablet Chew 1 tablet by mouth daily.       Marland Kitchen lisinopril (PRINIVIL,ZESTRIL) 10 MG tablet 1/2 tab daily      . rizatriptan (MAXALT-MLT) 10 MG disintegrating tablet TAKE 1 TABLET BY MOUTH AS NEEDED FOR MIGRAINE. *REPEAT IN 2 HOURS IF NEEDED*  10 tablet  2  . [DISCONTINUED] escitalopram (LEXAPRO) 10 MG tablet         EXAM:  BP 130/84  Pulse 108  Temp 98.9 F (37.2 C) (Oral)  Wt 115 lb (52.164 kg)  SpO2 95%  There is no height on file to calculate BMI.  WDWN in NAD  quiet respirations; congested   Non toxic . HEENT: Normocephalic ;atraumatic , Eyes;  PERRL, EOMs  Full, lids and conjunctiva clear,,Ears: no deformities, canals nl, bony lm in tact  TM landmarks normal, Nose: cleara  discharge but congested;face minimally tender Mouth : OP clear without lesion or edema .mild redness  Neck: Supple without adenopathy or masses or bruits mildly tender right ac area  Chest:  Clear to A&P without wheezes rales or rhonchi CV:  S1-S2 no gallops or murmurs peripheral perfusion is normal Skin :nl perfusion and no acute rashes  Neuro non focal  ASSESSMENT AND PLAN:  Discussed the following assessment and plan:  1. Ear pain, right    eustachian tube and referred   2. Acute respiratory infection    Note for work  Can use aftrin at night for a few nights  -Patient advised to return or notify health care team  immediately if symptoms worsen or persist or new concerns arise.  Patient Instructions  Lungs are clear and there is no ear infection . This is  An acute viral infections withg eustachian tube dysfunction and some radiation of pain from throat and neck.  Expect fever to be gone in another day or of 2 .  Symptomatic  Relief  There are no curative  medications. meds for comfort . Rest and fluids.   Contact us if alarm symptoms of relapsing persistent fever  Shortness of breath  Or sever local pain etc.   No work until fever gone   Call if want hydrocodone cough med for comfort    Burna Mortimer K. Dealva Lafoy M.D.

## 2012-04-24 ENCOUNTER — Encounter: Payer: Self-pay | Admitting: Internal Medicine

## 2012-04-24 ENCOUNTER — Ambulatory Visit (INDEPENDENT_AMBULATORY_CARE_PROVIDER_SITE_OTHER): Payer: BC Managed Care – PPO | Admitting: Internal Medicine

## 2012-04-24 VITALS — BP 130/86 | HR 96 | Temp 98.5°F | Wt 116.0 lb

## 2012-04-24 DIAGNOSIS — J019 Acute sinusitis, unspecified: Secondary | ICD-10-CM

## 2012-04-24 MED ORDER — AMOXICILLIN 875 MG PO TABS: 875.0000 mg | ORAL_TABLET | Freq: Three times a day (TID) | ORAL | Status: DC

## 2012-04-24 NOTE — Patient Instructions (Addendum)
Agree treatment for sinusitis. Can try afrin nose spray for up to 3 days in a row to help with pressure and congestion.   Add antibiotic . Expect improvement in the next 3-5 days ro so . Contact us if not getting better .    Sinusitis Sinusitis is redness, soreness, and swelling (inflammation) of the paranasal sinuses. Paranasal sinuses are air pockets within the bones of your face (beneath the eyes, the middle of the forehead, or above the eyes). In healthy paranasal sinuses, mucus is able to drain out, and air is able to circulate through them by way of your nose. However, when your paranasal sinuses are inflamed, mucus and air can become trapped. This can allow bacteria and other germs to grow and cause infection. Sinusitis can develop quickly and last only a short time (acute) or continue over a long period (chronic). Sinusitis that lasts for more than 12 weeks is considered chronic.  CAUSES  Causes of sinusitis include:  Allergies.  Structural abnormalities, such as displacement of the cartilage that separates your nostrils (deviated septum), which can decrease the air flow through your nose and sinuses and affect sinus drainage.  Functional abnormalities, such as when the small hairs (cilia) that line your sinuses and help remove mucus do not work properly or are not present. SYMPTOMS  Symptoms of acute and chronic sinusitis are the same. The primary symptoms are pain and pressure around the affected sinuses. Other symptoms include:  Upper toothache.  Earache.  Headache.  Bad breath.  Decreased sense of smell and taste.  A cough, which worsens when you are lying flat.  Fatigue.  Fever.  Thick drainage from your nose, which often is green and may contain pus (purulent).  Swelling and warmth over the affected sinuses. DIAGNOSIS  Your caregiver will perform a physical exam. During the exam, your caregiver may:  Look in your nose for signs of abnormal growths in your  nostrils (nasal polyps).  Tap over the affected sinus to check for signs of infection.  View the inside of your sinuses (endoscopy) with a special imaging device with a light attached (endoscope), which is inserted into your sinuses. If your caregiver suspects that you have chronic sinusitis, one or more of the following tests may be recommended:  Allergy tests.  Nasal culture A sample of mucus is taken from your nose and sent to a lab and screened for bacteria.  Nasal cytology A sample of mucus is taken from your nose and examined by your caregiver to determine if your sinusitis is related to an allergy. TREATMENT  Most cases of acute sinusitis are related to a viral infection and will resolve on their own within 10 days. Sometimes medicines are prescribed to help relieve symptoms (pain medicine, decongestants, nasal steroid sprays, or saline sprays).  However, for sinusitis related to a bacterial infection, your caregiver will prescribe antibiotic medicines. These are medicines that will help kill the bacteria causing the infection.  Rarely, sinusitis is caused by a fungal infection. In theses cases, your caregiver will prescribe antifungal medicine. For some cases of chronic sinusitis, surgery is needed. Generally, these are cases in which sinusitis recurs more than 3 times per year, despite other treatments. HOME CARE INSTRUCTIONS   Drink plenty of water. Water helps thin the mucus so your sinuses can drain more easily.  Use a humidifier.  Inhale steam 3 to 4 times a day (for example, sit in the bathroom with the shower running).  Apply a warm, moist  washcloth to your face 3 to 4 times a day, or as directed by your caregiver.  Use saline nasal sprays to help moisten and clean your sinuses.  Take over-the-counter or prescription medicines for pain, discomfort, or fever only as directed by your caregiver. SEEK IMMEDIATE MEDICAL CARE IF:  You have increasing pain or severe  headaches.  You have nausea, vomiting, or drowsiness.  You have swelling around your face.  You have vision problems.  You have a stiff neck.  You have difficulty breathing. MAKE SURE YOU:   Understand these instructions.  Will watch your condition.  Will get help right away if you are not doing well or get worse. Document Released: 03/06/2005 Document Revised: 05/29/2011 Document Reviewed: 03/21/2011 [facility] Patient Information 2013 [place], [place].

## 2012-04-24 NOTE — Progress Notes (Signed)
Chief Complaint  Patient presents with  . Cough    Nasal congestion is green/brown in color.  . Nasal Congestion  . Hoarse  . Sore Throat    HPI: Patient comes in today for SDA for  new problem evaluation. was seen mid jan for uri flu like sx and ear pain with dx viral infection. Since that time   Got some better and then sore throat and lost voice.  And now left face pain.  And significant congestion Nettie pot doesn't go up the nose has Flonase asks if she should use it. Continued left face pressure but no fever ROS: See pertinent positives and negatives per HPI. No chest pain shortness of breath hemoptysis  No recent. Antibiotics.  Past Medical History  Diagnosis Date  . Heart murmur     had neg echo and ekg  . Headache   . Preeclampsia     ht in pregnancy   . Hypertension     Hosp 11/09 and K 3.2 neg eval, nl echo and neg aldopra ratio  . Palpitations   . History of miscarriage 2009  . Anxiety   . Panic attack 06/13/2011  . Hyperkalemia 07/08/2010  . HYPOKALEMIA, HX OF 02/18/2008    Qualifier: Diagnosis of  By: Fabian Sharp MD, Neta Mends   . Hx of syncope     summer poss heat and hydration  . IRREGULAR MENSES 09/06/2007    Qualifier: Diagnosis of  By: Lawernce Ion, CMA (AAMA), Bethann Berkshire CARDIAC MURMUR 07/12/2007    Qualifier: Diagnosis of  By: Lawernce Ion, CMA (AAMA), Bethann Berkshire     Family History  Problem Relation Age of Onset  . Hypertension Mother   . Diabetes type II      ggf  . Stroke      pgf     History   Social History  . Marital Status: Married    Spouse Name: N/A    Number of Children: N/A  . Years of Education: N/A   Social History Main Topics  . Smoking status: Never Smoker   . Smokeless tobacco: None  . Alcohol Use: Yes  . Drug Use: No  . Sexually Active:    Other Topics Concern  . 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 escitalopram (LEXAPRO) 10 MG tablet TAKE 1 TABLET BY MOUTH EVERY DAY  30 tablet  1  . escitalopram (LEXAPRO) 10 MG tablet TAKE 1 TABLET BY MOUTH EVERY DAY  30 tablet  5  . GENERESS FE 0.8-25 MG-MCG tablet Chew 1 tablet by mouth daily.       Marland Kitchen lisinopril (PRINIVIL,ZESTRIL) 10 MG tablet 1/2 tab daily      . rizatriptan (MAXALT-MLT) 10 MG disintegrating tablet TAKE 1 TABLET BY MOUTH AS NEEDED FOR MIGRAINE. *REPEAT IN 2 HOURS IF NEEDED*  10 tablet  2  . amoxicillin (AMOXIL) 875 MG tablet Take 1 tablet (875 mg total) by mouth 3 (three) times daily.  30 tablet  0    EXAM:  BP 130/86  Pulse 96  Temp 98.5 F (36.9 C) (Oral)  Wt 116 lb (52.617 kg)  SpO2 98%  There is no height on file to calculate BMI.  WDWN in NAD  quiet respirations; mildly congested  somewhat hoarse.  Non toxic . HEENT: Normocephalic ;atraumatic , Eyes;  PERRL, EOMs  Full, lids and conjunctiva clear,,Ears: no deformities, canals nl, TM landmarks normal, Nose: no deformity ; colored dc noted  congested;face  Left maxilla tender  Mouth : OP clear without lesion or edema . Mild redness drainage area  Neck: Supple without adenopathy or masses or bruits Chest:  Clear to A&P without wheezes rales or rhonchi CV:  S1-S2 no gallops or murmurs peripheral perfusion is normal Skin :nl perfusion and no acute rashes  PSYCH: pleasant and cooperative, no obvious depression or anxiety  ASSESSMENT AND PLAN:  Discussed the following assessment and plan:  1. Acute sinusitis with symptoms greater than 10 days    relapsing viral illnesss now left face pain  sinusitis    -Patient advised to return or notify health care team  if symptoms worsen or persist or new concerns arise.  Patient Instructions  Agree treatment for sinusitis. Can try afrin nose spray for up to 3 days in a row to help with pressure and congestion.   Add antibiotic . Expect  improvement in the next 3-5 days ro so . Contact us if not getting better .    Sinusitis Sinusitis is redness, soreness, and swelling (inflammation) of the paranasal sinuses. Paranasal sinuses are air pockets within the bones of your face (beneath the eyes, the middle of the forehead, or above the eyes). In healthy paranasal sinuses, mucus is able to drain out, and air is able to circulate through them by way of your nose. However, when your paranasal sinuses are inflamed, mucus and air can become trapped. This can allow bacteria and other germs to grow and cause infection. Sinusitis can develop quickly and last only a short time (acute) or continue over a long period (chronic). Sinusitis that lasts for more than 12 weeks is considered chronic.  CAUSES  Causes of sinusitis include:  Allergies.  Structural abnormalities, such as displacement of the cartilage that separates your nostrils (deviated septum), which can decrease the air flow through your nose and sinuses and affect sinus drainage.  Functional abnormalities, such as when the small hairs (cilia) that line your sinuses and help remove mucus do not work properly or are not present. SYMPTOMS  Symptoms of acute and chronic sinusitis are the same. The primary symptoms are pain and pressure around the affected sinuses. Other symptoms include:  Upper toothache.  Earache.  Headache.  Bad breath.  Decreased sense of smell and taste.  A cough, which worsens when you are lying flat.  Fatigue.  Fever.  Thick drainage from your nose, which often is green and may contain pus (purulent).  Swelling and warmth over the affected sinuses. DIAGNOSIS  Your caregiver will perform a physical exam. During the exam, your caregiver may:  Look in your nose for signs of abnormal growths in your nostrils (nasal polyps).  Tap over the affected sinus to check for signs of infection.  View the inside of your sinuses (endoscopy) with a special  imaging device with a light attached (endoscope), which is inserted into your sinuses. If your caregiver suspects that you have chronic sinusitis, one or more of the following tests may be recommended:  Allergy tests.  Nasal culture A sample of mucus is taken from your nose and sent to a lab and screened for bacteria.  Nasal cytology A sample of mucus is taken from your nose and examined by your caregiver to determine if your sinusitis is related to an allergy. TREATMENT  Most cases of acute sinusitis are related to a viral infection and will resolve on their own within 10 days. Sometimes medicines are prescribed to help relieve symptoms (pain medicine, decongestants, nasal steroid sprays, or saline sprays).  However, for sinusitis related to a bacterial infection, your caregiver will prescribe antibiotic medicines. These are medicines that will help kill the bacteria causing the infection.  Rarely, sinusitis is caused by a fungal infection. In theses cases, your caregiver will prescribe antifungal medicine. For some cases of chronic sinusitis, surgery is needed. Generally, these are cases in which sinusitis recurs more than 3 times per year, despite other treatments. HOME CARE INSTRUCTIONS   Drink plenty of water. Water helps thin the mucus so your sinuses can drain more easily.  Use a humidifier.  Inhale steam 3 to 4 times a day (for example, sit in the bathroom with the shower running).  Apply a warm, moist washcloth to your face 3 to 4 times a day, or as directed by your caregiver.  Use saline nasal sprays to help moisten and clean your sinuses.  Take over-the-counter or prescription medicines for pain, discomfort, or fever only as directed by your caregiver. SEEK IMMEDIATE MEDICAL CARE IF:  You have increasing pain or severe headaches.  You have nausea, vomiting, or drowsiness.  You have swelling around your face.  You have vision problems.  You have a stiff neck.  You have  difficulty breathing. MAKE SURE YOU:   Understand these instructions.  Will watch your condition.  Will get help right away if you are not doing well or get worse. Document Released: 03/06/2005 Document Revised: 05/29/2011 Document Reviewed: 03/21/2011 [facility] Patient Information 2013 [place], [place].      Neta Mends. Panosh M.D.

## 2012-07-11 ENCOUNTER — Other Ambulatory Visit: Payer: Self-pay | Admitting: Internal Medicine

## 2012-08-09 ENCOUNTER — Other Ambulatory Visit: Payer: Self-pay | Admitting: Internal Medicine

## 2012-08-10 ENCOUNTER — Other Ambulatory Visit: Payer: Self-pay | Admitting: Internal Medicine

## 2012-12-18 ENCOUNTER — Other Ambulatory Visit: Payer: Self-pay | Admitting: Internal Medicine

## 2012-12-18 NOTE — Telephone Encounter (Signed)
Caller: Xoie/Patient; Phone: [phone]; Reason for Call: Patient calling, it is taking 3 Maxalt to relieve her migraine h/a pain.  Each prescription of 9 tabs is $150 with insurance.  She is asking if there is a cheaper medication that would work?   Uses [facility].

## 2013-01-24 ENCOUNTER — Other Ambulatory Visit: Payer: Self-pay | Admitting: Internal Medicine

## 2013-01-27 ENCOUNTER — Telehealth: Payer: Self-pay | Admitting: Internal Medicine

## 2013-01-27 NOTE — Telephone Encounter (Signed)
Spoke to the pt.  Notified her to call her insurance company and see what is a covered medication.  She will call back after she speaks with them.

## 2013-01-27 NOTE — Telephone Encounter (Signed)
Pt was on maxalt 10 mg. Pt would like to change to differ med due to cost. [facility] [address]

## 2013-01-27 NOTE — Telephone Encounter (Signed)
Left message on cell for the pt to return my call.  Pt needs to be notified that she will need to call her insurance company and find out what medication is covered.

## 2013-03-03 ENCOUNTER — Other Ambulatory Visit: Payer: Self-pay | Admitting: Internal Medicine

## 2013-03-07 ENCOUNTER — Other Ambulatory Visit (INDEPENDENT_AMBULATORY_CARE_PROVIDER_SITE_OTHER): Payer: BC Managed Care – PPO

## 2013-03-07 DIAGNOSIS — Z0000 Encounter for general adult medical examination without abnormal findings: Secondary | ICD-10-CM

## 2013-03-07 LAB — CBC WITH DIFFERENTIAL/PLATELET
Basophils Absolute: 0 10*3/uL (ref 0.0–0.1)
Basophils Relative: 0.3 % (ref 0.0–3.0)
Eosinophils Absolute: 0 10*3/uL (ref 0.0–0.7)
Hemoglobin: 14 g/dL (ref 12.0–15.0)
MCHC: 34 g/dL (ref 30.0–36.0)
MCV: 87.8 fl (ref 78.0–100.0)
Monocytes Absolute: 0.4 10*3/uL (ref 0.1–1.0)
Neutro Abs: 3.3 10*3/uL (ref 1.4–7.7)
RBC: 4.69 Mil/uL (ref 3.87–5.11)
RDW: 12.4 % (ref 11.5–14.6)

## 2013-03-07 LAB — HEPATIC FUNCTION PANEL
ALT: 13 U/L (ref 0–35)
AST: 17 U/L (ref 0–37)
Bilirubin, Direct: 0 mg/dL (ref 0.0–0.3)
Total Protein: 7.4 g/dL (ref 6.0–8.3)

## 2013-03-07 LAB — LIPID PANEL
Cholesterol: 228 mg/dL — ABNORMAL HIGH (ref 0–200)
HDL: 55.9 mg/dL (ref >39.00)
Total CHOL/HDL Ratio: 4
Triglycerides: 134 mg/dL (ref 0.0–149.0)
VLDL: 26.8 mg/dL (ref 0.0–40.0)

## 2013-03-07 LAB — BASIC METABOLIC PANEL
CO2: 26 mEq/L (ref 19–32)
Calcium: 9.6 mg/dL (ref 8.4–10.5)
Chloride: 105 mEq/L (ref 96–112)
Creatinine, Ser: 0.8 mg/dL (ref 0.4–1.2)
Glucose, Bld: 82 mg/dL (ref 70–99)

## 2013-03-07 LAB — LDL CHOLESTEROL, DIRECT: Direct LDL: 166.2 mg/dL

## 2013-03-18 ENCOUNTER — Other Ambulatory Visit: Payer: Self-pay | Admitting: Internal Medicine

## 2013-03-18 ENCOUNTER — Ambulatory Visit (INDEPENDENT_AMBULATORY_CARE_PROVIDER_SITE_OTHER): Payer: BC Managed Care – PPO | Admitting: Internal Medicine

## 2013-03-18 ENCOUNTER — Encounter: Payer: Self-pay | Admitting: Internal Medicine

## 2013-03-18 VITALS — BP 132/80 | Temp 99.3°F | Ht 63.25 in | Wt 120.0 lb

## 2013-03-18 DIAGNOSIS — I10 Essential (primary) hypertension: Secondary | ICD-10-CM

## 2013-03-18 DIAGNOSIS — M79609 Pain in unspecified limb: Secondary | ICD-10-CM

## 2013-03-18 DIAGNOSIS — N943 Premenstrual tension syndrome: Secondary | ICD-10-CM

## 2013-03-18 DIAGNOSIS — F411 Generalized anxiety disorder: Secondary | ICD-10-CM

## 2013-03-18 DIAGNOSIS — E7800 Pure hypercholesterolemia, unspecified: Secondary | ICD-10-CM

## 2013-03-18 DIAGNOSIS — Z0000 Encounter for general adult medical examination without abnormal findings: Secondary | ICD-10-CM

## 2013-03-18 MED ORDER — ESCITALOPRAM OXALATE 10 MG PO TABS: ORAL_TABLET | ORAL | Status: DC

## 2013-03-18 MED ORDER — LISINOPRIL 10 MG PO TABS: ORAL_TABLET | ORAL | Status: DC

## 2013-03-18 NOTE — Patient Instructions (Signed)
Add exercise back and this should help your cholesterol and perhaps neck also.  Fu if any sx concerning  persistent or progressive  Can try lexapro 5 alternating 10 and down to 5 per day if wish. But ok to remain on med.  If doing well then wellness visit and labs in 1 year .,

## 2013-03-18 NOTE — Assessment & Plan Note (Signed)
Stable on current regimen consider dec dose  refill

## 2013-03-18 NOTE — Assessment & Plan Note (Signed)
Stable on med refill and follow

## 2013-03-18 NOTE — Addendum Note (Signed)
Addended byBerniece Andreas K on: 03/18/2013 05:40 PM   Modules accepted: Orders

## 2013-03-18 NOTE — Progress Notes (Signed)
Chief Complaint  Patient presents with  . Annual Exam  . Hypertension    HPI: Patient comes in today for Preventive Health Care visit  No major change in health status since last visit . 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  Sometimes  Distracted and memory issues  Accidentally through out a bill when going to trash  Neg tobacco little  Etoh. No caffiene.  Not a lot of exercise recently  Health Maintenance  Topic Date Due  . Influenza Vaccine  02/17/2014  . Pap Smear  01/21/2016  . Tetanus/tdap  07/11/2017   Health Maintenance Review   ROS:  GEN/ HEENT: No fever, significant weight changes sweats headaches vision problems hearing changes, CV/ PULM; No chest pain shortness of breath cough, syncope,edema  change in exercise tolerance. GI /GU: No adominal pain, vomiting, change in bowel habits. No blood in the stool. No significant GU symptoms. SKIN/HEME: ,no acute skin rashes suspicious lesions or bleeding. No lymphadenopathy, nodules, masses.  NEURO/ PSYCH:  No neurologic signs such as weakness numbness. No depression anxiety. Neck pain at times postinal postural from work and burning upper thorax  IMM/ Allergy: No unusual infections.  Allergy .   REST of 12 system review negative except as per HPI   Past Medical History  Diagnosis Date  . Heart murmur     had neg echo and ekg  . Headache(784.0)   . Preeclampsia     ht in pregnancy   . Hypertension     Hosp 11/09 and K 3.2 neg eval, nl echo and neg aldopra ratio  . Palpitations   . History of miscarriage 2009  . Anxiety   . Panic attack 06/13/2011  . Hyperkalemia 07/08/2010  . HYPOKALEMIA, HX OF 02/18/2008    Qualifier: Diagnosis of  By: Fabian Sharp MD, Neta Mends   . Hx of syncope     summer poss heat and hydration  . IRREGULAR MENSES 09/06/2007    Qualifier: Diagnosis of  By: Lawernce Ion, CMA (AAMA), Bethann Berkshire CARDIAC MURMUR 07/12/2007    Qualifier:  Diagnosis of  By: Lawernce Ion, CMA (AAMA), Bethann Berkshire     Family History  Problem Relation Age of Onset  . Hypertension Mother   . Diabetes type II      ggf  . Stroke      pgf     History   Social History  . Marital Status: Married    Spouse Name: N/A    Number of Children: N/A  . Years of Education: N/A   Social History Main Topics  . Smoking status: Never Smoker   . Smokeless tobacco: None  . Alcohol Use: Yes  . Drug Use: No  . Sexual Activity:    Other Topics Concern  . 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 escitalopram (LEXAPRO) 10 MG tablet TAKE 1 TABLET BY MOUTH EVERY DAY  . GENERESS FE 0.8-25 MG-MCG tablet Chew 1 tablet by mouth daily.   Marland Kitchen lisinopril (PRINIVIL,ZESTRIL) 10 MG tablet Take  5mg  daily  . rizatriptan (MAXALT-MLT) 10 MG disintegrating tablet DISSOLVE 1 TABLET IN MOUTH AS NEEDED FOR MIGRAINE. *REPEAT IN 2 HOURS IF NEEDED*  . [DISCONTINUED] lisinopril (PRINIVIL,ZESTRIL) 10 MG tablet TAKE 1 TABLET DAILY  . [DISCONTINUED] amoxicillin (AMOXIL) 875 MG tablet Take 1 tablet (875 mg total) by mouth 3 (three) times daily.  . [DISCONTINUED] lisinopril (PRINIVIL,ZESTRIL) 10 MG tablet 1/2 tab daily    EXAM:  BP 132/80  Temp(Src) 99.3 F (37.4 C) (Oral)  Ht 5' 3.25" (1.607 m)  Wt 120 lb (54.432 kg)  BMI 21.08 kg/m2  Body mass index is 21.08 kg/(m^2).  Physical Exam: Vital signs reviewed JXB:JYNW is a well-developed well-nourished alert cooperative   female who appears her stated age in no acute distress.  HEENT: normocephalic atraumatic , Eyes: PERRL EOM's full, conjunctiva clear, Nares: paten,t no deformity discharge or tenderness., Ears: no deformity EAC's clear TMs with normal landmarks. Mouth: clear OP, no lesions,  edema.  Moist mucous membranes. Dentition in adequate repair. NECK: supple without masses, thyromegaly or bruits. CHEST/PULM:  Clear to auscultation and percussion breath sounds equal no wheeze , rales or rhonchi. No chest wall deformities or tenderness. Breast: normal by inspection . No dimpling, discharge, masses, tenderness or discharge . CV: PMI is nondisplaced, S1 S2 no gallops, murmurs, rubs. Peripheral pulses are full without delay.No JVD .  ABDOMEN: Bowel sounds normal nontender  No guard or rebound, no hepato splenomegal no CVA tenderness.  No hernia. Extremtities:  No clubbing cyanosis or edema, no acute joint swelling or redness no focal atrophy NEURO:  Oriented x3, cranial nerves 3-12 appear to be intact, no obvious focal weakness,gait within normal limits no abnormal reflexes or asymmetrical SKIN: No acute rashes normal turgor, color, no bruising or petechiae. PSYCH: Oriented, good eye contact, no obvious depression anxiety, cognition and judgment appear normal. LN: no cervical axillary inguinal adenopathy  Lab Results  Component Value Date   WBC 5.5 03/07/2013   HGB 14.0 03/07/2013   HCT 41.1 03/07/2013   PLT 227.0 03/07/2013   GLUCOSE 82 03/07/2013   CHOL 228* 03/07/2013   TRIG 134.0 03/07/2013   HDL 55.90 03/07/2013   LDLDIRECT 166.2 03/07/2013   LDLCALC 103* 03/05/2009   ALT 13 03/07/2013   AST 17 03/07/2013   NA 138 03/07/2013   K 4.3 03/07/2013   CL 105 03/07/2013   CREATININE 0.8 03/07/2013   BUN 12 03/07/2013   CO2 26 03/07/2013   TSH 1.31 03/07/2013   INR 1.1 02/12/2008   HGBA1C  Value: 5.2 (NOTE)   The ADA recommends the following therapeutic goal for glycemic   control related to Hgb A1C measurement:   Goal of Therapy:   < 7.0% Hgb A1C   Reference: American Diabetes Association: Clinical Practice   Recommendations 2008, Diabetes Care,  2008, 31:(Suppl 1). 02/12/2008    ASSESSMENT AND PLAN:  Discussed the following assessment and plan:  Visit for  preventive health examination - utd   LABILE HYPERTENSION  Arm pain, unspecified laterality - off an on suspect ms neck and upper back postional   ANXIETY  MIGRAINE, MENSTRUAL  Elevated cholesterol - ldl rising ratio still acceptable   In LSI Exercises given for neck arm fu if  persistent or progressive or alarm sx  Disc focusing distreaction etc actually doing quite well bp etc.  Do not suspect underlying disease state . Follow   Patient Care Team: Madelin Headings, MD as PCP - General Oliver Pila, MD (Obstetrics and Gynecology)  Patient Instructions  Add exercise back and this should help your cholesterol and perhaps neck also.  Fu if any sx concerning  persistent or progressive  Can try lexapro 5 alternating 10 and down to 5 per day if wish. But ok to remain on med.  If doing well then wellness visit and labs in 1 year .,   Neta Mends. Panosh M.D.   Pre visit review using our clinic review tool, if applicable. No additional management support is needed unless otherwise documented below in the visit note.

## 2013-03-18 NOTE — Assessment & Plan Note (Signed)
Mostly pre menstrual on pills better on pills  Uses maxalt when needed

## 2013-03-20 ENCOUNTER — Other Ambulatory Visit: Payer: Self-pay | Admitting: Internal Medicine

## 2013-10-03 ENCOUNTER — Other Ambulatory Visit: Payer: Self-pay | Admitting: Internal Medicine

## 2013-10-07 NOTE — Telephone Encounter (Signed)
Last filled 03/21/13 #10 with 3 refills. Please advise.

## 2013-10-07 NOTE — Telephone Encounter (Signed)
Sent in

## 2013-12-04 ENCOUNTER — Other Ambulatory Visit: Payer: Self-pay | Admitting: Internal Medicine

## 2014-01-19 ENCOUNTER — Encounter: Payer: Self-pay | Admitting: Internal Medicine

## 2014-02-06 ENCOUNTER — Telehealth: Payer: Self-pay | Admitting: Internal Medicine

## 2014-02-06 MED ORDER — RIZATRIPTAN BENZOATE 10 MG PO TBDP: ORAL_TABLET | ORAL | Status: DC

## 2014-02-06 NOTE — Telephone Encounter (Signed)
Ok to refill x 1 (#10 )   cpx appt is pending

## 2014-02-06 NOTE — Telephone Encounter (Signed)
Sent to [facility].  WP only authorized #10.  Not for 3 months.

## 2014-02-06 NOTE — Telephone Encounter (Signed)
[facility] - [place], [place] - [address] [phone] is requesting re-fill on rizatriptan (MAXALT-MLT) 10 MG disintegrating tablet

## 2014-03-19 ENCOUNTER — Other Ambulatory Visit (INDEPENDENT_AMBULATORY_CARE_PROVIDER_SITE_OTHER): Payer: BC Managed Care – PPO

## 2014-03-19 DIAGNOSIS — Z0000 Encounter for general adult medical examination without abnormal findings: Secondary | ICD-10-CM

## 2014-03-19 LAB — LIPID PANEL
CHOLESTEROL: 251 mg/dL — AB (ref 0–200)
HDL: 56.4 mg/dL (ref >39.00)
LDL CALC: 163 mg/dL — AB (ref 0–99)
NonHDL: 194.6
TRIGLYCERIDES: 158 mg/dL — AB (ref 0.0–149.0)
Total CHOL/HDL Ratio: 4
VLDL: 31.6 mg/dL (ref 0.0–40.0)

## 2014-03-19 LAB — CBC WITH DIFFERENTIAL/PLATELET
BASOS PCT: 0.5 % (ref 0.0–3.0)
Basophils Absolute: 0 10*3/uL (ref 0.0–0.1)
Eosinophils Absolute: 0.1 10*3/uL (ref 0.0–0.7)
Eosinophils Relative: 1.4 % (ref 0.0–5.0)
HCT: 40.6 % (ref 36.0–46.0)
HEMOGLOBIN: 13.4 g/dL (ref 12.0–15.0)
LYMPHS PCT: 39.2 % (ref 12.0–46.0)
Lymphs Abs: 2.1 10*3/uL (ref 0.7–4.0)
MCHC: 33.1 g/dL (ref 30.0–36.0)
MCV: 89.6 fl (ref 78.0–100.0)
MONOS PCT: 8.4 % (ref 3.0–12.0)
Monocytes Absolute: 0.4 10*3/uL (ref 0.1–1.0)
NEUTROS ABS: 2.7 10*3/uL (ref 1.4–7.7)
Neutrophils Relative %: 50.5 % (ref 43.0–77.0)
Platelets: 241 10*3/uL (ref 150.0–400.0)
RBC: 4.53 Mil/uL (ref 3.87–5.11)
RDW: 13.1 % (ref 11.5–15.5)
WBC: 5.3 10*3/uL (ref 4.0–10.5)

## 2014-03-19 LAB — HEPATIC FUNCTION PANEL
ALBUMIN: 3.8 g/dL (ref 3.5–5.2)
ALK PHOS: 33 U/L — AB (ref 39–117)
ALT: 13 U/L (ref 0–35)
AST: 14 U/L (ref 0–37)
BILIRUBIN DIRECT: 0 mg/dL (ref 0.0–0.3)
BILIRUBIN TOTAL: 0.3 mg/dL (ref 0.2–1.2)
Total Protein: 7 g/dL (ref 6.0–8.3)

## 2014-03-19 LAB — BASIC METABOLIC PANEL
BUN: 10 mg/dL (ref 6–23)
CHLORIDE: 107 meq/L (ref 96–112)
CO2: 25 meq/L (ref 19–32)
Calcium: 9.2 mg/dL (ref 8.4–10.5)
Creatinine, Ser: 0.7 mg/dL (ref 0.4–1.2)
GFR: 102.03 mL/min (ref >60.00)
Glucose, Bld: 81 mg/dL (ref 70–99)
Potassium: 4.7 mEq/L (ref 3.5–5.1)
Sodium: 137 mEq/L (ref 135–145)

## 2014-03-19 LAB — TSH: TSH: 1.64 u[IU]/mL (ref 0.35–4.50)

## 2014-03-23 ENCOUNTER — Telehealth: Payer: Self-pay | Admitting: Internal Medicine

## 2014-03-23 NOTE — Telephone Encounter (Signed)
Sent to the pharmacy by e-scribe for #10 with no refills.

## 2014-03-23 NOTE — Telephone Encounter (Signed)
Pt needs her Maxalt ASAP as she has taken three in the past day and thrown up all three.

## 2014-03-27 ENCOUNTER — Ambulatory Visit (INDEPENDENT_AMBULATORY_CARE_PROVIDER_SITE_OTHER): Payer: BLUE CROSS/BLUE SHIELD | Admitting: Internal Medicine

## 2014-03-27 ENCOUNTER — Encounter: Payer: Self-pay | Admitting: Internal Medicine

## 2014-03-27 VITALS — BP 108/74 | HR 74 | Ht 63.25 in | Wt 121.5 lb

## 2014-03-27 DIAGNOSIS — I10 Essential (primary) hypertension: Secondary | ICD-10-CM

## 2014-03-27 DIAGNOSIS — Z0000 Encounter for general adult medical examination without abnormal findings: Secondary | ICD-10-CM

## 2014-03-27 DIAGNOSIS — Z79899 Other long term (current) drug therapy: Secondary | ICD-10-CM

## 2014-03-27 DIAGNOSIS — R51 Headache: Secondary | ICD-10-CM

## 2014-03-27 DIAGNOSIS — F411 Generalized anxiety disorder: Secondary | ICD-10-CM

## 2014-03-27 DIAGNOSIS — E785 Hyperlipidemia, unspecified: Secondary | ICD-10-CM

## 2014-03-27 DIAGNOSIS — R519 Headache, unspecified: Secondary | ICD-10-CM

## 2014-03-27 MED ORDER — ESCITALOPRAM OXALATE 10 MG PO TABS: ORAL_TABLET | ORAL | Status: DC

## 2014-03-27 MED ORDER — LORAZEPAM 0.5 MG PO TABS: 0.5000 mg | ORAL_TABLET | Freq: Three times a day (TID) | ORAL | Status: DC | PRN

## 2014-03-27 NOTE — Progress Notes (Signed)
Chief Complaint  Patient presents with  . Annual Exam    HPI: Patient  Anna JordanJanice L Poole  44 y.o. comes in today for Preventive Health Care visit . And fu ht anxiety and HAs    Anxiety  When travel  Some panic events  Otherwise doing ok on med lexapro  BP: doing ok.   HA :  Migraines   Still    Menstrual  At first helped and now now not  3 pills left helped .   At work hot and felt like like.  fainting .  And  Wash cloth at face .  Vomited . X 1  Not controlled now on ocps for at least 4 months and not effect good or bad  For migraines  Health Maintenance  Topic Date Due  . INFLUENZA VACCINE  11/19/2014 (Originally 10/18/2013)  . PAP SMEAR  01/21/2016  . TETANUS/TDAP  07/11/2017   Health Maintenance Review LIFESTYLE:  Exercise:  Not squat  Tobacco/ETS: no Alcohol: per day  Social  Rare  Sugar beverages: ocass soda  3 x per week  Sleep:  About 6-8  Drug use: no:  PAP: pap dr Senaida Oresrichardson MAMMO: utd .  ROS:  GEN/ HEENT: No fever, significant weight changes sweats  vision problems hearing changes, CV/ PULM; No chest pain shortness of breath cough, syncope,edema  change in exercise tolerance. GI /GU: No adominal pain, change in bowel habits. No blood in the stool. No significant GU symptoms. SKIN/HEME: ,no acute skin rashes suspicious lesions or bleeding. No lymphadenopathy, nodules, masses.  NEURO/ PSYCH:  No neurologic signs such as weakness numbness. No depression  IMM/ Allergy: No unusual infections.  Allergy .   REST of 12 system review negative except as per HPI   Past Medical History  Diagnosis Date  . Heart murmur     had neg echo and ekg  . Headache(784.0)   . Preeclampsia     ht in pregnancy   . Hypertension     Hosp 11/09 and K 3.2 neg eval, nl echo and neg aldopra ratio  . Palpitations   . History of miscarriage 2009  . Anxiety   . Panic attack 06/13/2011  . Hyperkalemia 07/08/2010  . HYPOKALEMIA, HX OF 02/18/2008    Qualifier: Diagnosis of  By: Fabian SharpPanosh  MD, Neta MendsWanda K   . Hx of syncope     summer poss heat and hydration  . IRREGULAR MENSES 09/06/2007    Qualifier: Diagnosis of  By: Lawernce Ionranford, CMA (AAMA), Bethann BerkshireShannon S   . CARDIAC MURMUR 07/12/2007    Qualifier: Diagnosis of  By: Lawernce Ionranford, CMA (AAMA), Bethann BerkshireShannon S     Past Surgical History  Procedure Laterality Date  . Ovarian cyst removal  1991    richardson    Family History  Problem Relation Age of Onset  . Hypertension Mother   . Diabetes type II      ggf  . Stroke      pgf     History   Social History  . Marital Status: Married    Spouse Name: N/A    Number of Children: N/A  . Years of Education: N/A   Social History Main Topics  . Smoking status: Never Smoker   . Smokeless tobacco: None  . Alcohol Use: Yes  . Drug Use: No  . Sexual Activity: None   Other Topics Concern  . 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 GENERESS FE 0.8-25 MG-MCG tablet Chew 1 tablet by mouth daily.   Marland Kitchen lisinopril (PRINIVIL,ZESTRIL) 10 MG tablet Take 5 mg -10 mg daily.  . rizatriptan (MAXALT-MLT) 10 MG disintegrating tablet DISSOLVE 1 TABLET BY MOUTH AS NEEDED FOR MIGRAINE *REPEAT IN 2 HOURS IF NEEDED*  . [DISCONTINUED] escitalopram (LEXAPRO) 10 MG tablet TAKE 1 TABLET BY MOUTH EVERY DAY  . escitalopram (LEXAPRO) 10 MG tablet TAKE 1 TABLET BY MOUTH EVERY DAY  . LORazepam (ATIVAN) 0.5 MG tablet Take 1-2 tablets (0.5-1 mg total) by mouth every 8 (eight) hours as needed for anxiety.    EXAM:  BP 108/74 mmHg  Pulse 74  Ht 5' 3.25" (1.607 m)  Wt 121 lb 8 oz (55.112 kg)  BMI 21.34 kg/m2  Body mass index is 21.34 kg/(m^2).  Physical Exam: Vital signs reviewed JYN:WGNF is a well-developed well-nourished alert cooperative    who appearsr stated age in no acute  distress.  HEENT: normocephalic atraumatic , Eyes: PERRL EOM's full, conjunctiva clear, Nares: paten,t no deformity discharge or tenderness., Ears: no deformity EAC's clear TMs with normal landmarks. Mouth: clear OP, no lesions, edema.  Moist mucous membranes. Dentition in adequate repair. NECK: supple without masses, thyromegaly or bruits. CHEST/PULM:  Clear to auscultation and percussion breath sounds equal no wheeze , rales or rhonchi. No chest wall deformities or tenderness. Breast: normal by inspection . No dimpling, discharge, masses, tenderness or discharge . CV: PMI is nondisplaced, S1 S2 no gallops, murmurs, rubs. Peripheral pulses are full without delay.No JVD .  ABDOMEN: Bowel sounds normal nontender  No guard or rebound, no hepato splenomegal no CVA tenderness.  No hernia. Extremtities:  No clubbing cyanosis or edema, no acute joint swelling or redness no focal atrophy NEURO:  Oriented x3, cranial nerves 3-12 appear to be intact, no obvious focal weakness,gait within normal limits no abnormal reflexes or asymmetrical SKIN: No acute rashes normal turgor, color, no bruising or petechiae. PSYCH: Oriented, good eye contact, no obvious depression anxiety, cognition and judgment appear normal. LN: no cervical axillary inguinal adenopathy  Lab Results  Component Value Date   WBC 5.3 03/19/2014   HGB 13.4 03/19/2014   HCT 40.6 03/19/2014   PLT 241.0 03/19/2014   GLUCOSE 81 03/19/2014   CHOL 251* 03/19/2014   TRIG 158.0* 03/19/2014   HDL 56.40 03/19/2014   LDLDIRECT 166.2 03/07/2013   LDLCALC 163* 03/19/2014   ALT 13 03/19/2014   AST 14 03/19/2014   NA 137 03/19/2014   K 4.7 03/19/2014   CL 107 03/19/2014   CREATININE 0.7 03/19/2014   BUN 10 03/19/2014   CO2 25 03/19/2014   TSH 1.64 03/19/2014   INR 1.1 02/12/2008   HGBA1C  02/12/2008    5.2 (NOTE)   The ADA recommends the following therapeutic goal for glycemic   control related to Hgb A1C measurement:   Goal of Therapy:   <  7.0% Hgb A1C   Reference: American Diabetes Association: Clinical Practice   Recommendations 2008, Diabetes Care,  2008, 31:(Suppl 1).    ASSESSMENT AND PLAN:  Discussed the following assessment and plan:  Visit for preventive health examination  Medication management  Hyperlipidemia  Essential hypertension  Recurrent headache  Patient Care Team: Madelin Headings, MD as PCP - General Oliver Pila, MD (Obstetrics and Gynecology) Patient Instructions   Continue lifestyle intervention healthy eating and add exercise a sadvised  Will do HA referral  Continue to track headaches  Possibly top ocps use other contraception. Such as moirena iud    bp  Is good today  Reconsider flu vaccine Try the lexapro in the am .   Can add panic med if needed . Some foods also may trigger migraine as well as sleep change and periods.      Why follow it? Research shows. . Those who follow the Mediterranean diet have a reduced risk of heart disease  . The diet is associated with a reduced incidence of Parkinson's and Alzheimer's diseases . People following the diet may have longer life expectancies and lower rates of chronic diseases  . The Dietary Guidelines for Americans recommends the Mediterranean diet as an eating plan to promote health and prevent disease  What Is the Mediterranean Diet?  . Healthy eating plan based on typical foods and recipes of Mediterranean-style cooking . The diet is primarily a plant based diet; these foods should make up a majority of meals   Starches - Plant based foods should make up a majority of meals - They are an important sources of vitamins, minerals, energy, antioxidants, and fiber - Choose whole grains, foods high in fiber and minimally processed items  - Typical grain sources include wheat, oats, barley, corn, brown rice, bulgar, farro, millet, polenta, couscous  - Various types of beans include chickpeas, lentils, fava beans, black beans, white beans     Fruits  Veggies - Large quantities of antioxidant rich fruits & veggies; 6 or more servings  - Vegetables can be eaten raw or lightly drizzled with oil and cooked  - Vegetables common to the traditional Mediterranean Diet include: artichokes, arugula, beets, broccoli, brussel sprouts, cabbage, carrots, celery, collard greens, cucumbers, eggplant, kale, leeks, lemons, lettuce, mushrooms, okra, onions, peas, peppers, potatoes, pumpkin, radishes, rutabaga, shallots, spinach, sweet potatoes, turnips, zucchini - Fruits common to the Mediterranean Diet include: apples, apricots, avocados, cherries, clementines, dates, figs, grapefruits, grapes, melons, nectarines, oranges, peaches, pears, pomegranates, strawberries, tangerines  Fats - Replace butter and margarine with healthy oils, such as olive oil, canola oil, and tahini  - Limit nuts to no more than a handful a day  - Nuts include walnuts, almonds, pecans, pistachios, pine nuts  - Limit or avoid candied, honey roasted or heavily salted nuts - Olives are central to the [employer] - can be eaten whole or used in a variety of dishes   Meats Protein - Limiting red meat: no more than a few times a month - When eating red meat: choose lean cuts and keep the portion to the size of deck of cards - Eggs: approx. 0 to 4 times a week  - Fish and lean poultry: at least 2 a week  - Healthy protein sources include, chicken, [place], lean beef, lamb - Increase intake of seafood such as tuna, salmon, trout, mackerel, shrimp, scallops - Avoid or limit high fat processed meats such as sausage and bacon  Dairy - Include moderate amounts of low fat dairy products  - Focus on healthy dairy such as fat free yogurt, skim milk, low or reduced fat cheese - Limit dairy products higher in fat such as whole or 2% milk, cheese, ice cream  Alcohol - Moderate amounts of red wine  is ok  - No more than 5 oz daily for women (all ages) and men older than age 18  - No more  than 10 oz of wine daily for men younger than 67  Other - Limit sweets and other desserts  - Use herbs and spices instead of salt to flavor foods  - Herbs and spices common to the traditional Mediterranean Diet include: basil, bay leaves, chives, cloves, cumin, fennel, garlic, lavender, marjoram, mint, oregano, parsley, pepper, rosemary, sage, savory, sumac, tarragon, thyme   It's not just a diet, it's a lifestyle:  . The Mediterranean diet includes lifestyle factors typical of those in the region  . Foods, drinks and meals are best eaten with others and savored . Daily physical activity is important for overall good health . This could be strenuous exercise like running and aerobics . This could also be more leisurely activities such as walking, housework, yard-work, or taking the stairs . Moderation is the key; a balanced and healthy diet accommodates most foods and drinks . Consider portion sizes and frequency of consumption of certain foods   Meal Ideas & Options:  . Breakfast:  o Whole wheat toast or whole wheat English muffins with peanut butter & hard boiled egg o Steel cut oats topped with apples & cinnamon and skim milk  o Fresh fruit: banana, strawberries, melon, berries, peaches  o Smoothies: strawberries, bananas, greek yogurt, peanut butter o Low fat greek yogurt with blueberries and granola  o Egg white omelet with spinach and mushrooms o Breakfast couscous: whole wheat couscous, apricots, skim milk, cranberries  . Sandwiches:  o Hummus and grilled vegetables (peppers, zucchini, squash) on whole wheat bread   o Grilled chicken on whole wheat pita with lettuce, tomatoes, cucumbers or tzatziki  o Tuna salad on whole wheat bread: tuna salad made with greek yogurt, olives, red peppers, capers, green onions o Garlic rosemary lamb pita: lamb sauted with garlic, rosemary, salt & pepper; add lettuce, cucumber, greek yogurt to pita - flavor with lemon juice and black pepper   . Seafood:  o Mediterranean grilled salmon, seasoned with garlic, basil, parsley, lemon juice and black pepper o Shrimp, lemon, and spinach whole-grain pasta salad made with low fat greek yogurt  o Seared scallops with lemon orzo  o Seared tuna steaks seasoned salt, pepper, coriander topped with tomato mixture of olives, tomatoes, olive oil, minced garlic, parsley, green onions and cappers  . Meats:  o Herbed greek chicken salad with kalamata olives, cucumber, feta  o Red bell peppers stuffed with spinach, bulgur, lean ground beef (or lentils) & topped with feta   o Kebabs: skewers of chicken, tomatoes, onions, zucchini, squash  o [place] burgers: made with red onions, mint, dill, lemon juice, feta cheese topped with roasted red peppers . Vegetarian o Cucumber salad: cucumbers, artichoke hearts, celery, red onion, feta cheese, tossed in olive oil & lemon juice  o Hummus and whole grain pita points with a greek salad (lettuce, tomato, feta, olives, cucumbers, red onion) o Lentil soup with celery, carrots made with vegetable broth, garlic, salt and pepper  o Tabouli salad: parsley, bulgur, mint, scallions, cucumbers, tomato, radishes, lemon juice, olive oil, salt and pepper.       ASCVD risj 0.5 % 10  YEAR risk     Neta Mends. Lundyn Coste M.D.

## 2014-03-27 NOTE — Patient Instructions (Addendum)
Continue lifestyle intervention healthy eating and add exercise a sadvised  Will do HA referral  Continue to track headaches  Possibly top ocps use other contraception. Such as moirena iud    bp  Is good today  Reconsider flu vaccine Try the lexapro in the am .   Can add panic med if needed . Some foods also may trigger migraine as well as sleep change and periods.      Why follow it? Research shows. . Those who follow the Mediterranean diet have a reduced risk of heart disease  . The diet is associated with a reduced incidence of Parkinson's and Alzheimer's diseases . People following the diet may have longer life expectancies and lower rates of chronic diseases  . The Dietary Guidelines for Americans recommends the Mediterranean diet as an eating plan to promote health and prevent disease  What Is the Mediterranean Diet?  . Healthy eating plan based on typical foods and recipes of Mediterranean-style cooking . The diet is primarily a plant based diet; these foods should make up a majority of meals   Starches - Plant based foods should make up a majority of meals - They are an important sources of vitamins, minerals, energy, antioxidants, and fiber - Choose whole grains, foods high in fiber and minimally processed items  - Typical grain sources include wheat, oats, barley, corn, brown rice, bulgar, farro, millet, polenta, couscous  - Various types of beans include chickpeas, lentils, fava beans, black beans, white beans   Fruits  Veggies - Large quantities of antioxidant rich fruits & veggies; 6 or more servings  - Vegetables can be eaten raw or lightly drizzled with oil and cooked  - Vegetables common to the traditional Mediterranean Diet include: artichokes, arugula, beets, broccoli, brussel sprouts, cabbage, carrots, celery, collard greens, cucumbers, eggplant, kale, leeks, lemons, lettuce, mushrooms, okra, onions, peas, peppers, potatoes, pumpkin, radishes, rutabaga, shallots, spinach,  sweet potatoes, turnips, zucchini - Fruits common to the Mediterranean Diet include: apples, apricots, avocados, cherries, clementines, dates, figs, grapefruits, grapes, melons, nectarines, oranges, peaches, pears, pomegranates, strawberries, tangerines  Fats - Replace butter and margarine with healthy oils, such as olive oil, canola oil, and tahini  - Limit nuts to no more than a handful a day  - Nuts include walnuts, almonds, pecans, pistachios, pine nuts  - Limit or avoid candied, honey roasted or heavily salted nuts - Olives are central to the [employer]iterranean diet - can be eaten whole or used in a variety of dishes   Meats Protein - Limiting red meat: no more than a few times a month - When eating red meat: choose lean cuts and keep the portion to the size of deck of cards - Eggs: approx. 0 to 4 times a week  - Fish and lean poultry: at least 2 a week  - Healthy protein sources include, chicken, [place], lean beef, lamb - Increase intake of seafood such as tuna, salmon, trout, mackerel, shrimp, scallops - Avoid or limit high fat processed meats such as sausage and bacon  Dairy - Include moderate amounts of low fat dairy products  - Focus on healthy dairy such as fat free yogurt, skim milk, low or reduced fat cheese - Limit dairy products higher in fat such as whole or 2% milk, cheese, ice cream  Alcohol - Moderate amounts of red wine is ok  - No more than 5 oz daily for women (all ages) and men older than age 44  - No more than 10  oz of wine daily for men younger than 29  Other - Limit sweets and other desserts  - Use herbs and spices instead of salt to flavor foods  - Herbs and spices common to the traditional Mediterranean Diet include: basil, bay leaves, chives, cloves, cumin, fennel, garlic, lavender, marjoram, mint, oregano, parsley, pepper, rosemary, sage, savory, sumac, tarragon, thyme   It's not just a diet, it's a lifestyle:  . The Mediterranean diet includes lifestyle factors  typical of those in the region  . Foods, drinks and meals are best eaten with others and savored . Daily physical activity is important for overall good health . This could be strenuous exercise like running and aerobics . This could also be more leisurely activities such as walking, housework, yard-work, or taking the stairs . Moderation is the key; a balanced and healthy diet accommodates most foods and drinks . Consider portion sizes and frequency of consumption of certain foods   Meal Ideas & Options:  . Breakfast:  o Whole wheat toast or whole wheat English muffins with peanut butter & hard boiled egg o Steel cut oats topped with apples & cinnamon and skim milk  o Fresh fruit: banana, strawberries, melon, berries, peaches  o Smoothies: strawberries, bananas, greek yogurt, peanut butter o Low fat greek yogurt with blueberries and granola  o Egg white omelet with spinach and mushrooms o Breakfast couscous: whole wheat couscous, apricots, skim milk, cranberries  . Sandwiches:  o Hummus and grilled vegetables (peppers, zucchini, squash) on whole wheat bread   o Grilled chicken on whole wheat pita with lettuce, tomatoes, cucumbers or tzatziki  o Tuna salad on whole wheat bread: tuna salad made with greek yogurt, olives, red peppers, capers, green onions o Garlic rosemary lamb pita: lamb sauted with garlic, rosemary, salt & pepper; add lettuce, cucumber, greek yogurt to pita - flavor with lemon juice and black pepper  . Seafood:  o Mediterranean grilled salmon, seasoned with garlic, basil, parsley, lemon juice and black pepper o Shrimp, lemon, and spinach whole-grain pasta salad made with low fat greek yogurt  o Seared scallops with lemon orzo  o Seared tuna steaks seasoned salt, pepper, coriander topped with tomato mixture of olives, tomatoes, olive oil, minced garlic, parsley, green onions and cappers  . Meats:  o Herbed greek chicken salad with kalamata olives, cucumber, feta  o Red  bell peppers stuffed with spinach, bulgur, lean ground beef (or lentils) & topped with feta   o Kebabs: skewers of chicken, tomatoes, onions, zucchini, squash  o [place] burgers: made with red onions, mint, dill, lemon juice, feta cheese topped with roasted red peppers . Vegetarian o Cucumber salad: cucumbers, artichoke hearts, celery, red onion, feta cheese, tossed in olive oil & lemon juice  o Hummus and whole grain pita points with a greek salad (lettuce, tomato, feta, olives, cucumbers, red onion) o Lentil soup with celery, carrots made with vegetable broth, garlic, salt and pepper  o Tabouli salad: parsley, bulgur, mint, scallions, cucumbers, tomato, radishes, lemon juice, olive oil, salt and pepper.       ASCVD risj 0.5 % 10  YEAR risk

## 2014-03-27 NOTE — Progress Notes (Signed)
Pre visit review using our clinic review tool, if applicable. No additional management support is needed unless otherwise documented below in the visit note. 

## 2014-03-28 DIAGNOSIS — E785 Hyperlipidemia, unspecified: Secondary | ICD-10-CM | POA: Insufficient documentation

## 2014-03-28 DIAGNOSIS — R519 Headache, unspecified: Secondary | ICD-10-CM | POA: Insufficient documentation

## 2014-03-28 DIAGNOSIS — R51 Headache: Secondary | ICD-10-CM

## 2014-03-28 DIAGNOSIS — I10 Essential (primary) hypertension: Secondary | ICD-10-CM | POA: Insufficient documentation

## 2014-03-30 ENCOUNTER — Telehealth: Payer: Self-pay | Admitting: Internal Medicine

## 2014-03-30 NOTE — Telephone Encounter (Signed)
emmi mailed  °

## 2014-04-28 ENCOUNTER — Telehealth: Payer: Self-pay | Admitting: *Deleted

## 2014-04-28 NOTE — Telephone Encounter (Signed)
Patient canceled her new patient appointment states she does not think her headaches are neuro related referring office notified Stanton Kidney(Debra)

## 2014-05-15 ENCOUNTER — Ambulatory Visit: Payer: BLUE CROSS/BLUE SHIELD | Admitting: Neurology

## 2014-05-15 ENCOUNTER — Telehealth: Payer: Self-pay | Admitting: Internal Medicine

## 2014-05-15 NOTE — Telephone Encounter (Signed)
Pt request refill of the following: rizatriptan (MAXALT-MLT) 10 MG disintegrating tablet   Pt said she need this medicine called in today    Phamacy:  [facility]

## 2014-05-15 NOTE — Telephone Encounter (Signed)
Sent to the pharmacy by e-scribe. 

## 2014-07-13 ENCOUNTER — Other Ambulatory Visit: Payer: Self-pay | Admitting: Internal Medicine

## 2014-07-14 MED ORDER — LISINOPRIL 10 MG PO TABS: ORAL_TABLET | ORAL | Status: DC

## 2014-07-14 NOTE — Addendum Note (Signed)
Addended by: Beverely LowFRAZIER, Farren Landa L on: 07/14/2014 09:59 AM   Modules accepted: Orders

## 2014-07-15 ENCOUNTER — Other Ambulatory Visit: Payer: Self-pay | Admitting: Family Medicine

## 2014-07-15 MED ORDER — LISINOPRIL 10 MG PO TABS: ORAL_TABLET | ORAL | Status: DC

## 2014-07-15 NOTE — Telephone Encounter (Signed)
Re sent to the pharmacy.  Original transmission failed on 07/14/14

## 2014-08-10 ENCOUNTER — Other Ambulatory Visit: Payer: Self-pay | Admitting: Internal Medicine

## 2014-08-10 NOTE — Telephone Encounter (Signed)
Sent to the pharmacy by e-scribe. 

## 2014-10-19 ENCOUNTER — Other Ambulatory Visit: Payer: Self-pay | Admitting: Internal Medicine

## 2014-10-20 NOTE — Telephone Encounter (Signed)
Sent to the pharmacy by e-scribe. 

## 2014-10-20 NOTE — Telephone Encounter (Signed)
Ok to refill x 3 

## 2014-11-02 ENCOUNTER — Ambulatory Visit (INDEPENDENT_AMBULATORY_CARE_PROVIDER_SITE_OTHER): Payer: BLUE CROSS/BLUE SHIELD | Admitting: Internal Medicine

## 2014-11-02 ENCOUNTER — Encounter: Payer: Self-pay | Admitting: Internal Medicine

## 2014-11-02 ENCOUNTER — Telehealth: Payer: Self-pay | Admitting: Internal Medicine

## 2014-11-02 VITALS — BP 130/90 | HR 59 | Temp 98.5°F | Resp 18 | Ht 63.25 in | Wt 121.0 lb

## 2014-11-02 DIAGNOSIS — M545 Low back pain: Secondary | ICD-10-CM | POA: Diagnosis not present

## 2014-11-02 DIAGNOSIS — I10 Essential (primary) hypertension: Secondary | ICD-10-CM | POA: Diagnosis not present

## 2014-11-02 DIAGNOSIS — R109 Unspecified abdominal pain: Secondary | ICD-10-CM | POA: Diagnosis not present

## 2014-11-02 LAB — POCT URINALYSIS DIPSTICK
BILIRUBIN UA: NEGATIVE
GLUCOSE UA: NEGATIVE
Ketones, UA: NEGATIVE
Nitrite, UA: NEGATIVE
PH UA: 6
Protein, UA: NEGATIVE
RBC UA: NEGATIVE
SPEC GRAV UA: 1.025
Urobilinogen, UA: 0.2

## 2014-11-02 NOTE — Progress Notes (Signed)
Pre visit review using our clinic review tool, if applicable. No additional management support is needed unless otherwise documented below in the visit note. 

## 2014-11-02 NOTE — Telephone Encounter (Signed)
Patient Name: Anna Poole DOB: [birth year] Initial Comment Caller states she is having right kidney pain- More frequent urination. Nurse Assessment Nurse: Charna Elizabeth, RN, Cathy Date/Time (Eastern Time): 11/02/2014 10:11:41 AM Confirm and document reason for call. If symptomatic, describe symptoms. ---Caller states she developed right flank pain with increased urination about 2-3 days ago. No fever. No injury in the past 3 days. Has the patient traveled out of the country within the last 30 days? ---No Does the patient require triage? ---Yes Related visit to physician within the last 2 weeks? ---No Does the PT have any chronic conditions? (i.e. diabetes, asthma, etc.) ---Yes List chronic conditions. ---High Blood Pressure, Panic attacks, Migraines Did the patient indicate they were pregnant? ---No Guidelines Guideline Title Affirmed Question Affirmed Notes Flank Pain MODERATE pain (e.g., interferes with normal activities or awakens from sleep) Final Disposition User See Physician within 24 Hours [place], RN, Lynden Ang Comments Scheduled for 4:15pm appointment today with Dr. Lottie Mussel. Referrals REFERRED TO PCP OFFICE Disagree/Comply: Comply

## 2014-11-02 NOTE — Patient Instructions (Addendum)
Most patients with low back pain will improve with time over the next two to 6 weeks.  Keep active but avoid any activities that cause pain.  Apply moist heat to the low back area several times daily.  Take Aleve 200 mg twice daily for pain or swelling Back Exercises Back exercises help treat and prevent back injuries. The goal of back exercises is to increase the strength of your abdominal and back muscles and the flexibility of your back. These exercises should be started when you no longer have back pain. Back exercises include:  Pelvic Tilt. Lie on your back with your knees bent. Tilt your pelvis until the lower part of your back is against the floor. Hold this position 5 to 10 sec and repeat 5 to 10 times.  Knee to Chest. Pull first 1 knee up against your chest and hold for 20 to 30 seconds, repeat this with the other knee, and then both knees. This may be done with the other leg straight or bent, whichever feels better.  Sit-Ups or Curl-Ups. Bend your knees 90 degrees. Start with tilting your pelvis, and do a partial, slow sit-up, lifting your trunk only 30 to 45 degrees off the floor. Take at least 2 to 3 seconds for each sit-up. Do not do sit-ups with your knees out straight. If partial sit-ups are difficult, simply do the above but with only tightening your abdominal muscles and holding it as directed.  Hip-Lift. Lie on your back with your knees flexed 90 degrees. Push down with your feet and shoulders as you raise your hips a couple inches off the floor; hold for 10 seconds, repeat 5 to 10 times.  Back arches. Lie on your stomach, propping yourself up on bent elbows. Slowly press on your hands, causing an arch in your low back. Repeat 3 to 5 times. Any initial stiffness and discomfort should lessen with repetition over time.  Shoulder-Lifts. Lie face down with arms beside your body. Keep hips and torso pressed to floor as you slowly lift your head and shoulders off the floor. Do not overdo  your exercises, especially in the beginning. Exercises may cause you some mild back discomfort which lasts for a few minutes; however, if the pain is more severe, or lasts for more than 15 minutes, do not continue exercises until you see your caregiver. Improvement with exercise therapy for back problems is slow.  See your caregivers for assistance with developing a proper back exercise program. Document Released: 04/13/2004 Document Revised: 05/29/2011 Document Reviewed: 01/05/2011 [facility] Patient Information 2015 [place], [place]. This information is not intended to replace advice given to you by your health care provider. Make sure you discuss any questions you have with your health care provider.

## 2014-11-02 NOTE — Progress Notes (Signed)
Subjective:    Patient ID: Anna Poole, female    DOB: 09/12/1970, 44 y.o.   MRN: [id number]  HPI 44 year old patient who presents with a chief complaint of low back and right flank discomfort.  She works as a Armed forces operational officer.  No prior history low back pain.  No GU symptoms, but concerned about a possible kidney infection.  Pain is aggravated by movement such as bending and twisting at the waist  Past Medical History  Diagnosis Date  . Heart murmur     had neg echo and ekg  . Headache(784.0)   . Preeclampsia     ht in pregnancy   . Hypertension     Hosp 11/09 and K 3.2 neg eval, nl echo and neg aldopra ratio  . Palpitations   . History of miscarriage 2009  . Anxiety   . Panic attack 06/13/2011  . Hyperkalemia 07/08/2010  . HYPOKALEMIA, HX OF 02/18/2008    Qualifier: Diagnosis of  By: Fabian Sharp MD, Neta Mends   . Hx of syncope     summer poss heat and hydration  . IRREGULAR MENSES 09/06/2007    Qualifier: Diagnosis of  By: Lawernce Ion, CMA (AAMA), Bethann Berkshire CARDIAC MURMUR 07/12/2007    Qualifier: Diagnosis of  By: Lawernce Ion, CMA (AAMA), Bethann Berkshire     Social History   Social History  . Marital Status: Married    Spouse Name: N/A  . Number of Children: N/A  . Years of Education: N/A   Occupational History  . Not on file.   Social History Main Topics  . Smoking status: Never Smoker   . Smokeless tobacco: Not on file  . Alcohol Use: Yes  . Drug Use: No  . Sexual Activity: Not on file   Other Topics Concern  . 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 Ovarian cyst removal  1991    [facility]    Family History  Problem Relation Age of Onset  . Hypertension Mother   . Diabetes type II      ggf  . Stroke      pgf     No Known  Allergies  Current Outpatient Prescriptions on File Prior to Visit  Medication Sig Dispense Refill  . escitalopram (LEXAPRO) 10 MG tablet TAKE 1 TABLET BY MOUTH EVERY DAY 90 tablet 3  . GENERESS FE 0.8-25 MG-MCG tablet Chew 1 tablet by mouth daily.     Marland Kitchen lisinopril (PRINIVIL,ZESTRIL) 10 MG tablet TAKE ONE-HALF (5 MG)  TO ONE TABLET DAILY 90 tablet 2  . LORazepam (ATIVAN) 0.5 MG tablet Take 1-2 tablets (0.5-1 mg total) by mouth every 8 (eight) hours as needed for anxiety. 20 tablet 0  . rizatriptan (MAXALT-MLT) 10 MG disintegrating tablet DISSOLVE 1 TABLET BY MOUTH AS NEEDED FOR MIGRAINE *REPEAT IN 2 HOURS IF NEEDED* 10 tablet 2   No current facility-administered medications on file prior to visit.    BP 130/90 mmHg  Pulse 59  Temp(Src) 98.5 F (36.9 C) (Oral)  Resp 18  Ht 5' 3.25" (1.607 m)  Wt 121 lb (54.885 kg)  BMI 21.25  kg/m2  SpO2 98%      Review of Systems  Constitutional: Negative.   HENT: Negative for congestion, dental problem, hearing loss, rhinorrhea, sinus pressure, sore throat and tinnitus.   Eyes: Negative for pain, discharge and visual disturbance.  Respiratory: Negative for cough and shortness of breath.   Cardiovascular: Negative for chest pain, palpitations and leg swelling.  Gastrointestinal: Negative for nausea, vomiting, abdominal pain, diarrhea, constipation, blood in stool and abdominal distention.  Genitourinary: Positive for flank pain. Negative for dysuria, urgency, frequency, hematuria, vaginal bleeding, vaginal discharge, difficulty urinating, vaginal pain and pelvic pain.  Musculoskeletal: Positive for back pain. Negative for joint swelling, arthralgias and gait problem.  Skin: Negative for rash.  Neurological: Negative for dizziness, syncope, speech difficulty, weakness, numbness and headaches.  Hematological: Negative for adenopathy.  Psychiatric/Behavioral: Negative for behavioral problems, dysphoric mood and agitation. The patient is not  nervous/anxious.        Objective:   Physical Exam  Constitutional: She appears well-developed and well-nourished. No distress.  Musculoskeletal:  No tenderness or spasm in the lumbosacral area.  Straight leg testing normal.  Full range of motion of both hips          Assessment & Plan:   Musculoligamentous low back pain.  Will treat with anti-inflammatories, rest and warm compresses.  Information concerning stretching exercises, dispensed.  Will call if she develops any new or worsening symptoms

## 2014-11-03 ENCOUNTER — Encounter: Payer: Self-pay | Admitting: Internal Medicine

## 2015-02-08 ENCOUNTER — Other Ambulatory Visit: Payer: Self-pay | Admitting: Internal Medicine

## 2015-02-09 NOTE — Telephone Encounter (Signed)
Ok to refill x 2  Sent in

## 2015-03-25 ENCOUNTER — Other Ambulatory Visit: Payer: Self-pay | Admitting: Internal Medicine

## 2015-03-26 NOTE — Telephone Encounter (Signed)
Sent to the pharmacy by e-scribe.  Pt has upcoming cpx on 08/09/15 

## 2015-04-05 LAB — HM PAP SMEAR

## 2015-04-06 ENCOUNTER — Encounter: Payer: Self-pay | Admitting: Family Medicine

## 2015-05-20 ENCOUNTER — Other Ambulatory Visit: Payer: Self-pay | Admitting: Internal Medicine

## 2015-05-21 NOTE — Telephone Encounter (Signed)
Sent to the pharmacy by e-scribe.  Last filled on 02/09/15 #10 with 1 additional refill.  Same sent Has up coming cpx on 08/09/15

## 2015-06-14 ENCOUNTER — Other Ambulatory Visit: Payer: Self-pay | Admitting: Internal Medicine

## 2015-06-15 NOTE — Telephone Encounter (Signed)
Ok x 1

## 2015-06-16 ENCOUNTER — Other Ambulatory Visit: Payer: Self-pay | Admitting: Family Medicine

## 2015-06-16 NOTE — Telephone Encounter (Signed)
Called and left on machine. 

## 2015-08-06 NOTE — Progress Notes (Signed)
Chief Complaint  Patient presents with  . Annual Exam    meds headaches  bp etc    HPI: Patient  Anna Poole  45 y.o. comes in today for Preventive Health Care visit  And med management  Etc.   BP:   Not checking level. Taking 5 mg acei no se   Weight concerfrom ocps or other    Migraines   t2 x per month   Maxalt. Helps but has to use almost 10 per month   Lasting up to 3-4 days / beginning of pack ( 4 days inert)  And  About day 18 of pack   Tracking headaches  In past  Know some of triggers   Only ocass use of lorazepam   For panic  Ok with this    Health Maintenance  Topic Date Due  . HIV Screening  01/20/1986  . INFLUENZA VACCINE  10/19/2015  . TETANUS/TDAP  07/11/2017  . PAP SMEAR  04/04/2018   Health Maintenance Review LIFESTYLE:  Exercise:   For last 6 weeks   In am .  Gym machine and cardio  Tobacco/ETS: no Alcohol: rare Sugar beverages: not regularly  Sleep: between 7-9  Better since exercising  Drug use: no Working around 36  Hours. hh of 5  Dog .    ROS:  GEN/ HEENT: No fever, significant weight changes sweats headaches vision problems hearing changes, CV/ PULM; No chest pain shortness of breath cough, syncope,edema  change in exercise tolerance. GI /GU: No adominal pain, vomiting, change in bowel habits. No blood in the stool. No significant GU symptoms. SKIN/HEME: ,no acute skin rashes suspicious lesions or bleeding. No lymphadenopathy, nodules, masses.  NEURO/ PSYCH:  No neurologic signs such as weakness numbness. No depression anxiety. IMM/ Allergy: No unusual infections.  Allergy .   REST of 12 system review negative except as per HPI   Past Medical History  Diagnosis Date  . Heart murmur     had neg echo and ekg  . Headache(784.0)   . Preeclampsia     ht in pregnancy   . Hypertension     Hosp 11/09 and K 3.2 neg eval, nl echo and neg aldopra ratio  . Palpitations   . History of miscarriage 2009  . Anxiety   . Panic attack  06/13/2011  . Hyperkalemia 07/08/2010  . HYPOKALEMIA, HX OF 02/18/2008    Qualifier: Diagnosis of  By: Regis Bill MD, Standley Brooking   . Hx of syncope     summer poss heat and hydration  . IRREGULAR MENSES 09/06/2007    Qualifier: Diagnosis of  By: Hulan Saas, CMA (AAMA), Quita Skye CARDIAC MURMUR 07/12/2007    Qualifier: Diagnosis of  By: Hulan Saas, CMA (AAMA), Quita Skye     Past Surgical History  Procedure Laterality Date  . Ovarian cyst removal  1991    [facility]    Family History  Problem Relation Age of Onset  . Hypertension Mother   . Diabetes type II      ggf  . Stroke      pgf    Mgm high lipids  A fib age 69  Social History   Social History  . Marital Status: Married    Spouse Name: N/A  . Number of Children: N/A  . Years of Education: N/A   Social History Main Topics  . Smoking status: Never Smoker   . Smokeless tobacco: None  . Alcohol Use: Yes  . Drug  Use: No  . Sexual Activity: Not Asked   Other Topics Concern  . 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 escitalopram (LEXAPRO) 10 MG tablet TAKE 1 TABLET DAILY 90 tablet 1  . GENERESS FE 0.8-25 MG-MCG tablet Chew 1 tablet by mouth daily.     Marland Kitchen lisinopril (PRINIVIL,ZESTRIL) 10 MG tablet TAKE ONE-HALF (5 MG)  TO ONE TABLET DAILY 90 tablet 2  . LORazepam (ATIVAN) 0.5 MG tablet TAKE 1 TO 2 TABLETS BY MOUTH EVERY 8 HOURS AS NEEDED FOR ANXIETY 20 tablet 0  . rizatriptan (MAXALT-MLT) 10 MG disintegrating tablet DISSOLVE 1 TABLET BY MOUTH AS NEEDED FOR MIGRAINE *REPEAT IN 2 HOURS IF NEEDED* 10 tablet 1   No facility-administered medications prior to visit.     EXAM:  BP 130/86 mmHg  Pulse 88  Temp(Src) 99.1 F (37.3 C) (Oral)  Ht 5' 3.5" (1.613 m)  Wt 128 lb (58.06 kg)  BMI  22.32 kg/m2  Body mass index is 22.32 kg/(m^2).  Physical Exam: Vital signs reviewed ULA:GTXM is a well-developed well-nourished alert cooperative    who appearsr stated age in no acute distress.  HEENT: normocephalic atraumatic , Eyes: PERRL EOM's full, conjunctiva clear, Nares: paten,t no deformity discharge or tenderness., Ears: no deformity EAC's clear TMs with normal landmarks. Mouth: clear OP, no lesions, edema.  Moist mucous membranes. Dentition in adequate repair. NECK: supple without masses, thyromegaly or bruits. CHEST/PULM:  Clear to auscultation and percussion breath sounds equal no wheeze , rales or rhonchi. No chest wall deformities or tenderness.Breast: normal by inspection . No dimpling, discharge, masses, tenderness or discharge . CV: PMI is nondisplaced, S1 S2 no gallops, murmurs, rubs. Peripheral pulses are full without delay.No JVD .  ABDOMEN: Bowel sounds normal nontender  No guard or rebound, no hepato splenomegal no CVA tenderness.  No hernia. Extremtities:  No clubbing cyanosis or edema, no acute joint swelling or redness no focal atrophy NEURO:  Oriented x3, cranial nerves 3-12 appear to be intact, no obvious focal weakness,gait within normal limits no abnormal reflexes or asymmetrical SKIN: No acute rashes normal turgor, color, no bruising or petechiae. PSYCH: Oriented, good eye contact, no obvious depression mild anxiety , cognition and judgment appear normal. LN: no cervical axillary inguinal adenopathy  Lab Results  Component Value Date   WBC 5.3 03/19/2014   HGB 13.4 03/19/2014   HCT 40.6 03/19/2014   PLT 241.0 03/19/2014   GLUCOSE 81 03/19/2014   CHOL 251* 03/19/2014   TRIG 158.0* 03/19/2014   HDL 56.40 03/19/2014   LDLDIRECT 166.2 03/07/2013   LDLCALC 163* 03/19/2014   ALT 13 03/19/2014   AST 14 03/19/2014   NA 137 03/19/2014   K 4.7 03/19/2014   CL 107 03/19/2014   CREATININE 0.7 03/19/2014   BUN 10 03/19/2014   CO2 25 03/19/2014   TSH 1.64  03/19/2014   INR 1.1 02/12/2008   HGBA1C  02/12/2008    5.2 (NOTE)   The ADA recommends the following therapeutic goal for glycemic   control related to Hgb A1C measurement:   Goal of Therapy:   < 7.0% Hgb A1C  Reference: American Diabetes Association: Clinical Practice   Recommendations 2008, Diabetes Care,  2008, 31:(Suppl 1).    ASSESSMENT AND PLAN:  Discussed the following assessment and plan:  Visit for preventive health examination - Plan: Basic metabolic panel, CBC with Differential, Comprehensive metabolic panel, Lipid panel, TSH  Medication management - Plan: Basic metabolic panel, CBC with Differential, Comprehensive metabolic panel, Lipid panel, TSH  Essential hypertension - Plan: Basic metabolic panel, CBC with Differential, Comprehensive metabolic panel, Lipid panel, TSH  Recurrent headache  Anxiety state considier ation of change home   May do better on  iud  Not that  She has had ha her whole life but now on ocps and following bp  . And age 60 although healthy    Advise ha evaluation   Patient Care Team: Burnis Medin, MD as PCP - General Paula Compton, MD (Obstetrics and Gynecology) Patient Instructions    Continue lifestyle intervention healthy eating and exercise . Advise see headache specialist   Either headache clinic in town  Or Dr Joretta Bachelor in [place] /neurology headache . Manipulating your homones may be helpful.  Track headaches   Migraine Buddy is one app. Still take 800 of ibuprofen or 2.5 aleve at onset of headache and can continue for 48 hours .   Will notify you  of labs when available. Health Maintenance, Female Adopting a healthy lifestyle and getting preventive care can go a long way to promote health and wellness. Talk with your health care provider about what schedule of regular examinations is right for you. This is a good chance for you to check in with your provider about disease prevention and staying healthy. In between checkups,  there are plenty of things you can do on your own. Experts have done a lot of research about which lifestyle changes and preventive measures are most likely to keep you healthy. Ask your health care provider for more information. WEIGHT AND DIET  Eat a healthy diet  Be sure to include plenty of vegetables, fruits, low-fat dairy products, and lean protein.  Do not eat a lot of foods high in solid fats, added sugars, or salt.  Get regular exercise. This is one of the most important things you can do for your health.  Most adults should exercise for at least 150 minutes each week. The exercise should increase your heart rate and make you sweat (moderate-intensity exercise).  Most adults should also do strengthening exercises at least twice a week. This is in addition to the moderate-intensity exercise.  Maintain a healthy weight  Body mass index (BMI) is a measurement that can be used to identify possible weight problems. It estimates body fat based on height and weight. Your health care provider can help determine your BMI and help you achieve or maintain a healthy weight.  For females 25 years of age and older:   A BMI below 18.5 is considered underweight.  A BMI of 18.5 to 24.9 is normal.  A BMI of 25 to 29.9 is considered overweight.  A BMI of 30 and above is considered obese.  Watch levels of cholesterol and blood lipids  You should start having your blood tested for lipids and cholesterol at 45 years of age, then have this test every 5 years.  You may need to have your cholesterol levels checked more often if:  Your lipid or cholesterol levels are high.  You are older than 45 years of age.  You are at high risk for heart disease.  CANCER SCREENING   Lung Cancer  Lung cancer screening is recommended for adults 66-40 years old who are at high risk for lung cancer because of a history of smoking.  A yearly low-dose CT scan of the lungs is recommended for people  who:  Currently smoke.  Have quit within the past 15 years.  Have at least a 30-pack-year history of smoking. A pack year is smoking an average of one pack of cigarettes a day for 1 year.  Yearly screening should continue until it has been 15 years since you quit.  Yearly screening should stop if you develop a health problem that would prevent you from having lung cancer treatment.  Breast Cancer  Practice breast self-awareness. This means understanding how your breasts normally appear and feel.  It also means doing regular breast self-exams. Let your health care provider know about any changes, no matter how small.  If you are in your 20s or 30s, you should have a clinical breast exam (CBE) by a health care provider every 1-3 years as part of a regular health exam.  If you are 36 or older, have a CBE every year. Also consider having a breast X-ray (mammogram) every year.  If you have a family history of breast cancer, talk to your health care provider about genetic screening.  If you are at high risk for breast cancer, talk to your health care provider about having an MRI and a mammogram every year.  Breast cancer gene (BRCA) assessment is recommended for women who have family members with BRCA-related cancers. BRCA-related cancers include:  Breast.  Ovarian.  Tubal.  Peritoneal cancers.  Results of the assessment will determine the need for genetic counseling and BRCA1 and BRCA2 testing. Cervical Cancer Your health care provider may recommend that you be screened regularly for cancer of the pelvic organs (ovaries, uterus, and vagina). This screening involves a pelvic examination, including checking for microscopic changes to the surface of your cervix (Pap test). You may be encouraged to have this screening done every 3 years, beginning at age 48.  For women ages 29-65, health care providers may recommend pelvic exams and Pap testing every 3 years, or they may recommend the  Pap and pelvic exam, combined with testing for human papilloma virus (HPV), every 5 years. Some types of HPV increase your risk of cervical cancer. Testing for HPV may also be done on women of any age with unclear Pap test results.  Other health care providers may not recommend any screening for nonpregnant women who are considered low risk for pelvic cancer and who do not have symptoms. Ask your health care provider if a screening pelvic exam is right for you.  If you have had past treatment for cervical cancer or a condition that could lead to cancer, you need Pap tests and screening for cancer for at least 20 years after your treatment. If Pap tests have been discontinued, your risk factors (such as having a new sexual partner) need to be reassessed to determine if screening should resume. Some women have medical problems that increase the chance of getting cervical cancer. In these cases, your health care provider may recommend more frequent screening and Pap tests. Colorectal Cancer  This type of cancer can be detected and often prevented.  Routine colorectal cancer screening usually begins at 45 years of age and continues through 45 years of age.  Your health care provider may recommend screening at an earlier age if you have risk factors  for colon cancer.  Your health care provider may also recommend using home test kits to check for hidden blood in the stool.  A small camera at the end of a tube can be used to examine your colon directly (sigmoidoscopy or colonoscopy). This is done to check for the earliest forms of colorectal cancer.  Routine screening usually begins at age 25.  Direct examination of the colon should be repeated every 5-10 years through 45 years of age. However, you may need to be screened more often if early forms of precancerous polyps or small growths are found. Skin Cancer  Check your skin from head to toe regularly.  Tell your health care provider about any new  moles or changes in moles, especially if there is a change in a mole's shape or color.  Also tell your health care provider if you have a mole that is larger than the size of a pencil eraser.  Always use sunscreen. Apply sunscreen liberally and repeatedly throughout the day.  Protect yourself by wearing long sleeves, pants, a wide-brimmed hat, and sunglasses whenever you are outside. HEART DISEASE, DIABETES, AND HIGH BLOOD PRESSURE   High blood pressure causes heart disease and increases the risk of stroke. High blood pressure is more likely to develop in:  People who have blood pressure in the high end of the normal range (130-139/85-89 mm Hg).  People who are overweight or obese.  People who are African American.  If you are 71-11 years of age, have your blood pressure checked every 3-5 years. If you are 55 years of age or older, have your blood pressure checked every year. You should have your blood pressure measured twice--once when you are at a hospital or clinic, and once when you are not at a hospital or clinic. Record the average of the two measurements. To check your blood pressure when you are not at a hospital or clinic, you can use:  An automated blood pressure machine at a pharmacy.  A home blood pressure monitor.  If you are between 88 years and 3 years old, ask your health care provider if you should take aspirin to prevent strokes.  Have regular diabetes screenings. This involves taking a blood sample to check your fasting blood sugar level.  If you are at a normal weight and have a low risk for diabetes, have this test once every three years after 44 years of age.  If you are overweight and have a high risk for diabetes, consider being tested at a younger age or more often. PREVENTING INFECTION  Hepatitis B  If you have a higher risk for hepatitis B, you should be screened for this virus. You are considered at high risk for hepatitis B if:  You were born in a  country where hepatitis B is common. Ask your health care provider which countries are considered high risk.  Your parents were born in a high-risk country, and you have not been immunized against hepatitis B (hepatitis B vaccine).  You have HIV or AIDS.  You use needles to inject street drugs.  You live with someone who has hepatitis B.  You have had sex with someone who has hepatitis B.  You get hemodialysis treatment.  You take certain medicines for conditions, including cancer, organ transplantation, and autoimmune conditions. Hepatitis C  Blood testing is recommended for:  Everyone born from [birth year] through 1965.  Anyone with known risk factors for hepatitis C. Sexually transmitted infections (STIs)  You should  be screened for sexually transmitted infections (STIs) including gonorrhea and chlamydia if:  You are sexually active and are younger than 45 years of age.  You are older than 45 years of age and your health care provider tells you that you are at risk for this type of infection.  Your sexual activity has changed since you were last screened and you are at an increased risk for chlamydia or gonorrhea. Ask your health care provider if you are at risk.  If you do not have HIV, but are at risk, it may be recommended that you take a prescription medicine daily to prevent HIV infection. This is called pre-exposure prophylaxis (PrEP). You are considered at risk if:  You are sexually active and do not regularly use condoms or know the HIV status of your partner(s).  You take drugs by injection.  You are sexually active with a partner who has HIV. Talk with your health care provider about whether you are at high risk of being infected with HIV. If you choose to begin PrEP, you should first be tested for HIV. You should then be tested every 3 months for as long as you are taking PrEP.  PREGNANCY   If you are premenopausal and you may become pregnant, ask your health care  provider about preconception counseling.  If you may become pregnant, take 400 to 800 micrograms (mcg) of folic acid every day.  If you want to prevent pregnancy, talk to your health care provider about birth control (contraception). OSTEOPOROSIS AND MENOPAUSE   Osteoporosis is a disease in which the bones lose minerals and strength with aging. This can result in serious bone fractures. Your risk for osteoporosis can be identified using a bone density scan.  If you are 68 years of age or older, or if you are at risk for osteoporosis and fractures, ask your health care provider if you should be screened.  Ask your health care provider whether you should take a calcium or vitamin D supplement to lower your risk for osteoporosis.  Menopause may have certain physical symptoms and risks.  Hormone replacement therapy may reduce some of these symptoms and risks. Talk to your health care provider about whether hormone replacement therapy is right for you.  HOME CARE INSTRUCTIONS   Schedule regular health, dental, and eye exams.  Stay current with your immunizations.   Do not use any tobacco products including cigarettes, chewing tobacco, or electronic cigarettes.  If you are pregnant, do not drink alcohol.  If you are breastfeeding, limit how much and how often you drink alcohol.  Limit alcohol intake to no more than 1 drink per day for nonpregnant women. One drink equals 12 ounces of beer, 5 ounces of wine, or 1 ounces of hard liquor.  Do not use street drugs.  Do not share needles.  Ask your health care provider for help if you need support or information about quitting drugs.  Tell your health care provider if you often feel depressed.  Tell your health care provider if you have ever been abused or do not feel safe at home.   This information is not intended to replace advice given to you by your health care provider. Make sure you discuss any questions you have with your health  care provider.   Document Released: 09/19/2010 Document Revised: 03/27/2014 Document Reviewed: 02/05/2013 Elsevier Interactive Patient Education 2016 [place]. Phyllip Claw M.D.

## 2015-08-09 ENCOUNTER — Encounter: Payer: Self-pay | Admitting: Internal Medicine

## 2015-08-09 ENCOUNTER — Ambulatory Visit (INDEPENDENT_AMBULATORY_CARE_PROVIDER_SITE_OTHER): Payer: BLUE CROSS/BLUE SHIELD | Admitting: Internal Medicine

## 2015-08-09 VITALS — BP 130/86 | HR 88 | Temp 99.1°F | Ht 63.5 in | Wt 128.0 lb

## 2015-08-09 DIAGNOSIS — Z0000 Encounter for general adult medical examination without abnormal findings: Secondary | ICD-10-CM

## 2015-08-09 DIAGNOSIS — F411 Generalized anxiety disorder: Secondary | ICD-10-CM | POA: Diagnosis not present

## 2015-08-09 DIAGNOSIS — Z0001 Encounter for general adult medical examination with abnormal findings: Secondary | ICD-10-CM

## 2015-08-09 DIAGNOSIS — Z79899 Other long term (current) drug therapy: Secondary | ICD-10-CM | POA: Diagnosis not present

## 2015-08-09 DIAGNOSIS — R519 Headache, unspecified: Secondary | ICD-10-CM

## 2015-08-09 DIAGNOSIS — I10 Essential (primary) hypertension: Secondary | ICD-10-CM | POA: Diagnosis not present

## 2015-08-09 DIAGNOSIS — R51 Headache: Secondary | ICD-10-CM

## 2015-08-09 LAB — CBC WITH DIFFERENTIAL/PLATELET
BASOS ABS: 0 10*3/uL (ref 0.0–0.1)
Basophils Relative: 0.4 % (ref 0.0–3.0)
EOS ABS: 0 10*3/uL (ref 0.0–0.7)
Eosinophils Relative: 0.7 % (ref 0.0–5.0)
HEMATOCRIT: 36.1 % (ref 36.0–46.0)
Hemoglobin: 12.3 g/dL (ref 12.0–15.0)
LYMPHS ABS: 1.9 10*3/uL (ref 0.7–4.0)
LYMPHS PCT: 32.4 % (ref 12.0–46.0)
MCHC: 34 g/dL (ref 30.0–36.0)
MCV: 87.6 fl (ref 78.0–100.0)
MONO ABS: 0.4 10*3/uL (ref 0.1–1.0)
Monocytes Relative: 6.9 % (ref 3.0–12.0)
NEUTROS ABS: 3.5 10*3/uL (ref 1.4–7.7)
NEUTROS PCT: 59.6 % (ref 43.0–77.0)
PLATELETS: 239 10*3/uL (ref 150.0–400.0)
RBC: 4.12 Mil/uL (ref 3.87–5.11)
RDW: 13.1 % (ref 11.5–15.5)
WBC: 5.9 10*3/uL (ref 4.0–10.5)

## 2015-08-09 LAB — LIPID PANEL
CHOL/HDL RATIO: 4
Cholesterol: 221 mg/dL — ABNORMAL HIGH (ref 0–200)
HDL: 49.3 mg/dL (ref >39.00)
LDL Cholesterol: 149 mg/dL — ABNORMAL HIGH (ref 0–99)
NONHDL: 171.29
Triglycerides: 110 mg/dL (ref 0.0–149.0)
VLDL: 22 mg/dL (ref 0.0–40.0)

## 2015-08-09 LAB — COMPREHENSIVE METABOLIC PANEL
ALT: 15 U/L (ref 0–35)
AST: 16 U/L (ref 0–37)
Albumin: 4 g/dL (ref 3.5–5.2)
Alkaline Phosphatase: 30 U/L — ABNORMAL LOW (ref 39–117)
BILIRUBIN TOTAL: 0.4 mg/dL (ref 0.2–1.2)
BUN: 12 mg/dL (ref 6–23)
CO2: 25 meq/L (ref 19–32)
Calcium: 9.2 mg/dL (ref 8.4–10.5)
Chloride: 104 mEq/L (ref 96–112)
Creatinine, Ser: 0.68 mg/dL (ref 0.40–1.20)
GFR: 99.65 mL/min (ref >60.00)
GLUCOSE: 77 mg/dL (ref 70–99)
Potassium: 3.6 mEq/L (ref 3.5–5.1)
SODIUM: 137 meq/L (ref 135–145)
Total Protein: 6.6 g/dL (ref 6.0–8.3)

## 2015-08-09 LAB — BASIC METABOLIC PANEL
BUN: 12 mg/dL (ref 6–23)
CO2: 25 mEq/L (ref 19–32)
Calcium: 9.2 mg/dL (ref 8.4–10.5)
Chloride: 104 mEq/L (ref 96–112)
Creatinine, Ser: 0.68 mg/dL (ref 0.40–1.20)
GFR: 99.65 mL/min (ref >60.00)
Glucose, Bld: 77 mg/dL (ref 70–99)
Potassium: 3.6 mEq/L (ref 3.5–5.1)
Sodium: 137 mEq/L (ref 135–145)

## 2015-08-09 LAB — TSH: TSH: 2.1 u[IU]/mL (ref 0.35–4.50)

## 2015-08-09 MED ORDER — RIZATRIPTAN BENZOATE 10 MG PO TBDP: ORAL_TABLET | ORAL | Status: DC

## 2015-08-09 NOTE — Assessment & Plan Note (Signed)
See  Now 2 x per month  Using up to 10 maxalt per month will track and get to see ha specialist  Character  HZ going on for years but  These now last 4 days .  Left eye pain area

## 2015-08-09 NOTE — Progress Notes (Signed)
Pre visit review using our clinic review tool, if applicable. No additional management support is needed unless otherwise documented below in the visit note. 

## 2015-08-09 NOTE — Patient Instructions (Addendum)
Continue lifestyle intervention healthy eating and exercise . Advise see headache specialist   Either headache clinic in town  Or Dr Joretta Bachelor in [place] /neurology headache . Manipulating your homones may be helpful.  Track headaches   Migraine Buddy is one app. Still take 800 of ibuprofen or 2.5 aleve at onset of headache and can continue for 48 hours .   Will notify you  of labs when available. Health Maintenance, Female Adopting a healthy lifestyle and getting preventive care can go a long way to promote health and wellness. Talk with your health care provider about what schedule of regular examinations is right for you. This is a good chance for you to check in with your provider about disease prevention and staying healthy. In between checkups, there are plenty of things you can do on your own. Experts have done a lot of research about which lifestyle changes and preventive measures are most likely to keep you healthy. Ask your health care provider for more information. WEIGHT AND DIET  Eat a healthy diet  Be sure to include plenty of vegetables, fruits, low-fat dairy products, and lean protein.  Do not eat a lot of foods high in solid fats, added sugars, or salt.  Get regular exercise. This is one of the most important things you can do for your health.  Most adults should exercise for at least 150 minutes each week. The exercise should increase your heart rate and make you sweat (moderate-intensity exercise).  Most adults should also do strengthening exercises at least twice a week. This is in addition to the moderate-intensity exercise.  Maintain a healthy weight  Body mass index (BMI) is a measurement that can be used to identify possible weight problems. It estimates body fat based on height and weight. Your health care provider can help determine your BMI and help you achieve or maintain a healthy weight.  For females 14 years of age and older:   A BMI below 18.5 is  considered underweight.  A BMI of 18.5 to 24.9 is normal.  A BMI of 25 to 29.9 is considered overweight.  A BMI of 30 and above is considered obese.  Watch levels of cholesterol and blood lipids  You should start having your blood tested for lipids and cholesterol at 45 years of age, then have this test every 5 years.  You may need to have your cholesterol levels checked more often if:  Your lipid or cholesterol levels are high.  You are older than 45 years of age.  You are at high risk for heart disease.  CANCER SCREENING   Lung Cancer  Lung cancer screening is recommended for adults 35-6 years old who are at high risk for lung cancer because of a history of smoking.  A yearly low-dose CT scan of the lungs is recommended for people who:  Currently smoke.  Have quit within the past 15 years.  Have at least a 30-pack-year history of smoking. A pack year is smoking an average of one pack of cigarettes a day for 1 year.  Yearly screening should continue until it has been 15 years since you quit.  Yearly screening should stop if you develop a health problem that would prevent you from having lung cancer treatment.  Breast Cancer  Practice breast self-awareness. This means understanding how your breasts normally appear and feel.  It also means doing regular breast self-exams. Let your health care provider know about any changes, no matter how small.  If you are in your 20s or 30s, you should have a clinical breast exam (CBE) by a health care provider every 1-3 years as part of a regular health exam.  If you are 30 or older, have a CBE every year. Also consider having a breast X-ray (mammogram) every year.  If you have a family history of breast cancer, talk to your health care provider about genetic screening.  If you are at high risk for breast cancer, talk to your health care provider about having an MRI and a mammogram every year.  Breast cancer gene (BRCA)  assessment is recommended for women who have family members with BRCA-related cancers. BRCA-related cancers include:  Breast.  Ovarian.  Tubal.  Peritoneal cancers.  Results of the assessment will determine the need for genetic counseling and BRCA1 and BRCA2 testing. Cervical Cancer Your health care provider may recommend that you be screened regularly for cancer of the pelvic organs (ovaries, uterus, and vagina). This screening involves a pelvic examination, including checking for microscopic changes to the surface of your cervix (Pap test). You may be encouraged to have this screening done every 3 years, beginning at age 16.  For women ages 31-65, health care providers may recommend pelvic exams and Pap testing every 3 years, or they may recommend the Pap and pelvic exam, combined with testing for human papilloma virus (HPV), every 5 years. Some types of HPV increase your risk of cervical cancer. Testing for HPV may also be done on women of any age with unclear Pap test results.  Other health care providers may not recommend any screening for nonpregnant women who are considered low risk for pelvic cancer and who do not have symptoms. Ask your health care provider if a screening pelvic exam is right for you.  If you have had past treatment for cervical cancer or a condition that could lead to cancer, you need Pap tests and screening for cancer for at least 20 years after your treatment. If Pap tests have been discontinued, your risk factors (such as having a new sexual partner) need to be reassessed to determine if screening should resume. Some women have medical problems that increase the chance of getting cervical cancer. In these cases, your health care provider may recommend more frequent screening and Pap tests. Colorectal Cancer  This type of cancer can be detected and often prevented.  Routine colorectal cancer screening usually begins at 45 years of age and continues through 45 years  of age.  Your health care provider may recommend screening at an earlier age if you have risk factors for colon cancer.  Your health care provider may also recommend using home test kits to check for hidden blood in the stool.  A small camera at the end of a tube can be used to examine your colon directly (sigmoidoscopy or colonoscopy). This is done to check for the earliest forms of colorectal cancer.  Routine screening usually begins at age 36.  Direct examination of the colon should be repeated every 5-10 years through 45 years of age. However, you may need to be screened more often if early forms of precancerous polyps or small growths are found. Skin Cancer  Check your skin from head to toe regularly.  Tell your health care provider about any new moles or changes in moles, especially if there is a change in a mole's shape or color.  Also tell your health care provider if you have a mole that is larger than the  size of a pencil eraser.  Always use sunscreen. Apply sunscreen liberally and repeatedly throughout the day.  Protect yourself by wearing long sleeves, pants, a wide-brimmed hat, and sunglasses whenever you are outside. HEART DISEASE, DIABETES, AND HIGH BLOOD PRESSURE   High blood pressure causes heart disease and increases the risk of stroke. High blood pressure is more likely to develop in:  People who have blood pressure in the high end of the normal range (130-139/85-89 mm Hg).  People who are overweight or obese.  People who are African American.  If you are 40-67 years of age, have your blood pressure checked every 3-5 years. If you are 36 years of age or older, have your blood pressure checked every year. You should have your blood pressure measured twice--once when you are at a hospital or clinic, and once when you are not at a hospital or clinic. Record the average of the two measurements. To check your blood pressure when you are not at a hospital or clinic, you  can use:  An automated blood pressure machine at a pharmacy.  A home blood pressure monitor.  If you are between 55 years and 14 years old, ask your health care provider if you should take aspirin to prevent strokes.  Have regular diabetes screenings. This involves taking a blood sample to check your fasting blood sugar level.  If you are at a normal weight and have a low risk for diabetes, have this test once every three years after 45 years of age.  If you are overweight and have a high risk for diabetes, consider being tested at a younger age or more often. PREVENTING INFECTION  Hepatitis B  If you have a higher risk for hepatitis B, you should be screened for this virus. You are considered at high risk for hepatitis B if:  You were born in a country where hepatitis B is common. Ask your health care provider which countries are considered high risk.  Your parents were born in a high-risk country, and you have not been immunized against hepatitis B (hepatitis B vaccine).  You have HIV or AIDS.  You use needles to inject street drugs.  You live with someone who has hepatitis B.  You have had sex with someone who has hepatitis B.  You get hemodialysis treatment.  You take certain medicines for conditions, including cancer, organ transplantation, and autoimmune conditions. Hepatitis C  Blood testing is recommended for:  Everyone born from [birth year] through 1965.  Anyone with known risk factors for hepatitis C. Sexually transmitted infections (STIs)  You should be screened for sexually transmitted infections (STIs) including gonorrhea and chlamydia if:  You are sexually active and are younger than 45 years of age.  You are older than 45 years of age and your health care provider tells you that you are at risk for this type of infection.  Your sexual activity has changed since you were last screened and you are at an increased risk for chlamydia or gonorrhea. Ask your health  care provider if you are at risk.  If you do not have HIV, but are at risk, it may be recommended that you take a prescription medicine daily to prevent HIV infection. This is called pre-exposure prophylaxis (PrEP). You are considered at risk if:  You are sexually active and do not regularly use condoms or know the HIV status of your partner(s).  You take drugs by injection.  You are sexually active with a partner who  has HIV. Talk with your health care provider about whether you are at high risk of being infected with HIV. If you choose to begin PrEP, you should first be tested for HIV. You should then be tested every 3 months for as long as you are taking PrEP.  PREGNANCY   If you are premenopausal and you may become pregnant, ask your health care provider about preconception counseling.  If you may become pregnant, take 400 to 800 micrograms (mcg) of folic acid every day.  If you want to prevent pregnancy, talk to your health care provider about birth control (contraception). OSTEOPOROSIS AND MENOPAUSE   Osteoporosis is a disease in which the bones lose minerals and strength with aging. This can result in serious bone fractures. Your risk for osteoporosis can be identified using a bone density scan.  If you are 72 years of age or older, or if you are at risk for osteoporosis and fractures, ask your health care provider if you should be screened.  Ask your health care provider whether you should take a calcium or vitamin D supplement to lower your risk for osteoporosis.  Menopause may have certain physical symptoms and risks.  Hormone replacement therapy may reduce some of these symptoms and risks. Talk to your health care provider about whether hormone replacement therapy is right for you.  HOME CARE INSTRUCTIONS   Schedule regular health, dental, and eye exams.  Stay current with your immunizations.   Do not use any tobacco products including cigarettes, chewing tobacco, or  electronic cigarettes.  If you are pregnant, do not drink alcohol.  If you are breastfeeding, limit how much and how often you drink alcohol.  Limit alcohol intake to no more than 1 drink per day for nonpregnant women. One drink equals 12 ounces of beer, 5 ounces of wine, or 1 ounces of hard liquor.  Do not use street drugs.  Do not share needles.  Ask your health care provider for help if you need support or information about quitting drugs.  Tell your health care provider if you often feel depressed.  Tell your health care provider if you have ever been abused or do not feel safe at home.   This information is not intended to replace advice given to you by your health care provider. Make sure you discuss any questions you have with your health care provider.   Document Released: 09/19/2010 Document Revised: 03/27/2014 Document Reviewed: 02/05/2013 Elsevier Interactive Patient Education [employer].

## 2015-09-22 ENCOUNTER — Other Ambulatory Visit: Payer: Self-pay | Admitting: Internal Medicine

## 2015-09-22 NOTE — Telephone Encounter (Signed)
Sent to the pharmacy by e-scribe. 

## 2015-11-21 ENCOUNTER — Other Ambulatory Visit: Payer: Self-pay | Admitting: Internal Medicine

## 2015-11-23 ENCOUNTER — Telehealth: Payer: Self-pay | Admitting: Internal Medicine

## 2015-11-23 DIAGNOSIS — G43809 Other migraine, not intractable, without status migrainosus: Secondary | ICD-10-CM

## 2015-11-23 NOTE — Telephone Encounter (Signed)
Referral placed for referral to neuro.

## 2015-11-23 NOTE — Telephone Encounter (Signed)
Please arrange referral to dr Sharene SkeansHagen   Neuro headache specialist for recurrent chronic migraines .   [facility]

## 2015-11-23 NOTE — Telephone Encounter (Signed)
° °  Pt call to ask for a referral to see Dr Sharene SkeansHagen neuroglist for chronic migraines

## 2015-12-17 ENCOUNTER — Ambulatory Visit: Payer: BLUE CROSS/BLUE SHIELD | Admitting: Internal Medicine

## 2015-12-21 ENCOUNTER — Other Ambulatory Visit: Payer: Self-pay | Admitting: Internal Medicine

## 2015-12-22 NOTE — Telephone Encounter (Signed)
Sent in refill x 3

## 2016-02-09 ENCOUNTER — Other Ambulatory Visit: Payer: Self-pay | Admitting: Internal Medicine

## 2016-03-21 ENCOUNTER — Ambulatory Visit: Payer: BLUE CROSS/BLUE SHIELD | Admitting: Family Medicine

## 2016-03-21 ENCOUNTER — Ambulatory Visit (INDEPENDENT_AMBULATORY_CARE_PROVIDER_SITE_OTHER): Payer: BLUE CROSS/BLUE SHIELD | Admitting: Internal Medicine

## 2016-03-21 ENCOUNTER — Encounter: Payer: Self-pay | Admitting: Internal Medicine

## 2016-03-21 VITALS — BP 140/80 | Temp 98.5°F | Wt 127.0 lb

## 2016-03-21 DIAGNOSIS — J069 Acute upper respiratory infection, unspecified: Secondary | ICD-10-CM | POA: Diagnosis not present

## 2016-03-21 DIAGNOSIS — J018 Other acute sinusitis: Secondary | ICD-10-CM

## 2016-03-21 MED ORDER — AMOXICILLIN-POT CLAVULANATE 875-125 MG PO TABS: 1.0000 | ORAL_TABLET | Freq: Two times a day (BID) | ORAL | 0 refills | Status: DC

## 2016-03-21 NOTE — Patient Instructions (Signed)
Continue sinus hygiene     Sinusitis, Adult Sinusitis is soreness and inflammation of your sinuses. Sinuses are hollow spaces in the bones around your face. Your sinuses are located:  Around your eyes.  In the middle of your forehead.  Behind your nose.  In your cheekbones. Your sinuses and nasal passages are lined with a stringy fluid (mucus). Mucus normally drains out of your sinuses. When your nasal tissues become inflamed or swollen, the mucus can become trapped or blocked so air cannot flow through your sinuses. This allows bacteria, viruses, and funguses to grow, which leads to infection. Sinusitis can develop quickly and last for 7?10 days (acute) or for more than 12 weeks (chronic). Sinusitis often develops after a cold. What are the causes? This condition is caused by anything that creates swelling in the sinuses or stops mucus from draining, including:  Allergies.  Asthma.  Bacterial or viral infection.  Abnormally shaped bones between the nasal passages.  Nasal growths that contain mucus (nasal polyps).  Narrow sinus openings.  Pollutants, such as chemicals or irritants in the air.  A foreign object stuck in the nose.  A fungal infection. This is rare. What increases the risk? The following factors may make you more likely to develop this condition:  Having allergies or asthma.  Having had a recent cold or respiratory tract infection.  Having structural deformities or blockages in your nose or sinuses.  Having a weak immune system.  Doing a lot of swimming or diving.  Overusing nasal sprays.  Smoking. What are the signs or symptoms? The main symptoms of this condition are pain and a feeling of pressure around the affected sinuses. Other symptoms include:  Upper toothache.  Earache.  Headache.  Bad breath.  Decreased sense of smell and taste.  A cough that may get worse at night.  Fatigue.  Fever.  Thick drainage from your nose. The  drainage is often green and it may contain pus (purulent).  Stuffy nose or congestion.  Postnasal drip. This is when extra mucus collects in the throat or back of the nose.  Swelling and warmth over the affected sinuses.  Sore throat.  Sensitivity to light. How is this diagnosed? This condition is diagnosed based on symptoms, a medical history, and a physical exam. To find out if your condition is acute or chronic, your health care provider may:  Look in your nose for signs of nasal polyps.  Tap over the affected sinus to check for signs of infection.  View the inside of your sinuses using an imaging device that has a light attached (endoscope). If your health care provider suspects that you have chronic sinusitis, you may also:  Be tested for allergies.  Have a sample of mucus taken from your nose (nasal culture) and checked for bacteria.  Have a mucus sample examined to see if your sinusitis is related to an allergy. If your sinusitis does not respond to treatment and it lasts longer than 8 weeks, you may have an MRI or CT scan to check your sinuses. These scans also help to determine how severe your infection is. In rare cases, a bone biopsy may be done to rule out more serious types of fungal sinus disease. How is this treated? Treatment for sinusitis depends on the cause and whether your condition is chronic or acute. If a virus is causing your sinusitis, your symptoms will go away on their own within 10 days. You may be given medicines to relieve your  symptoms, including:  Topical nasal decongestants. They shrink swollen nasal passages and let mucus drain from your sinuses.  Antihistamines. These drugs block inflammation that is triggered by allergies. This can help to ease swelling in your nose and sinuses.  Topical nasal corticosteroids. These are nasal sprays that ease inflammation and swelling in your nose and sinuses.  Nasal saline washes. These rinses can help to get  rid of thick mucus in your nose. If your condition is caused by bacteria, you will be given an antibiotic medicine. If your condition is caused by a fungus, you will be given an antifungal medicine. Surgery may be needed to correct underlying conditions, such as narrow nasal passages. Surgery may also be needed to remove polyps. Follow these instructions at home: Medicines  Take, use, or apply over-the-counter and prescription medicines only as told by your health care provider. These may include nasal sprays.  If you were prescribed an antibiotic medicine, take it as told by your health care provider. Do not stop taking the antibiotic even if you start to feel better. Hydrate and Humidify  Drink enough water to keep your urine clear or pale yellow. Staying hydrated will help to thin your mucus.  Use a cool mist humidifier to keep the humidity level in your home above 50%.  Inhale steam for 10-15 minutes, 3-4 times a day or as told by your health care provider. You can do this in the bathroom while a hot shower is running.  Limit your exposure to cool or dry air. Rest  Rest as much as possible.  Sleep with your head raised (elevated).  Make sure to get enough sleep each night. General instructions  Apply a warm, moist washcloth to your face 3-4 times a day or as told by your health care provider. This will help with discomfort.  Wash your hands often with soap and water to reduce your exposure to viruses and other germs. If soap and water are not available, use hand sanitizer.  Do not smoke. Avoid being around people who are smoking (secondhand smoke).  Keep all follow-up visits as told by your health care provider. This is important. Contact a health care provider if:  You have a fever.  Your symptoms get worse.  Your symptoms do not improve within 10 days. Get help right away if:  You have a severe headache.  You have persistent vomiting.  You have pain or swelling  around your face or eyes.  You have vision problems.  You develop confusion.  Your neck is stiff.  You have trouble breathing. This information is not intended to replace advice given to you by your health care provider. Make sure you discuss any questions you have with your health care provider. Document Released: 03/06/2005 Document Revised: 10/31/2015 Document Reviewed: 12/30/2014 Elsevier Interactive Patient Education  2017 [employer].

## 2016-03-21 NOTE — Progress Notes (Signed)
Pre visit review using our clinic review tool, if applicable. No additional management support is needed unless otherwise documented below in the visit note.  Chief Complaint  Patient presents with  . Cough    X1wk  . Sinus Pressure/Pain  . Sore Throat  . Headache  . Nasal Congestion  . Post Nasal Drip    HPI: Anna MCPHEE 46 y.o.   sda   Onset 8 days ago sore throat upper respiratory drainage that had improved and then over the last 3 days got worse. Complains of Facial pressure   And green and bloody  Nasal drainage worse than Worse than  A reg cold etc. brb .  Splattering blood  No fever   mucinex netti pot flonase saline used This am and now crusty  Eyes and sore. Is now wearing glasses. She is a Armed forces operational officer. Allergies warm showers having nasal bridge eye pain area. No chest pain shortness of breath hemoptysis. No fever chills ROS: See pertinent positives and negatives per HPI.  Past Medical History:  Diagnosis Date  . Anxiety   . CARDIAC MURMUR 07/12/2007   Qualifier: Diagnosis of  By: Lawernce Ion, CMA (AAMA), Bethann Berkshire   . Headache(784.0)   . Heart murmur    had neg echo and ekg  . History of miscarriage 2009  . Hx of syncope    summer poss heat and hydration  . Hyperkalemia 07/08/2010  . Hypertension    Hosp 11/09 and K 3.2 neg eval, nl echo and neg aldopra ratio  . HYPOKALEMIA, HX OF 02/18/2008   Qualifier: Diagnosis of  By: Fabian Sharp MD, Neta Mends   . IRREGULAR MENSES 09/06/2007   Qualifier: Diagnosis of  By: Lawernce Ion, CMA (AAMA), Bethann Berkshire   . Palpitations   . Panic attack 06/13/2011  . Preeclampsia    ht in pregnancy     Family History  Problem Relation Age of Onset  . Hypertension Mother   . Diabetes type II      ggf  . Stroke      pgf     Social History   Social History  . Marital status: Married    Spouse name: N/A  . Number of children: N/A  . Years of education: N/A   Social History Main Topics  . Smoking status: Never Smoker  .  Smokeless tobacco: None  . Alcohol use Yes  . Drug use: No  . Sexual activity: Not Asked   Other Topics Concern  . 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 escitalopram (LEXAPRO) 10 MG tablet TAKE 1 TABLET DAILY 90 tablet 2  . GENERESS FE 0.8-25 MG-MCG tablet Chew 1 tablet by mouth daily.     Marland Kitchen lisinopril (PRINIVIL,ZESTRIL) 10 MG tablet TAKE ONE-HALF TO ONE TABLET DAILY 90 tablet 1  . LORazepam (ATIVAN) 0.5 MG tablet TAKE 1 TO 2 TABLETS BY MOUTH EVERY 8 HOURS AS NEEDED FOR ANXIETY 20 tablet 0  . rizatriptan (MAXALT-MLT) 10 MG disintegrating tablet DISSOLVE ONE TABLET BY MOUTH AS NEEDED FOR MIGRAINE. REPEAT IN 2 HOURS IF NEEDED. 10 tablet 0   No facility-administered medications  prior to visit.      EXAM:  BP 140/80 (BP Location: Right Arm, Patient Position: Sitting, Cuff Size: Normal)   Temp 98.5 F (36.9 C) (Oral)   Wt 127 lb (57.6 kg)   BMI 22.14 kg/m   Body mass index is 22.14 kg/m. WDWN in NAD  quiet respirations; mildly to mod congested  somewhat hoarse. Non toxic . HEENT: Normocephalic ;atraumatic , Eyes;  PERRL, EOMs  Full, lids and conjunctiva clear,? Slight crusting  Medially ,Ears: no deformities, canals nl, TM landmarks normal, Nose: no deformity mucoid dx b congested;face minimally tender ehtmoid area  Mouth : OP clear without lesion or edema . Neck: Supple without adenopathy or masses or bruits Chest:  Clear to A&P without wheezes rales or rhonchi CV:  S1-S2 no gallops or murmurs peripheral perfusion is normal Skin :nl perfusion and no acute rashes    ASSESSMENT AND PLAN:  Discussed the following assessment and plan:  Other acute sinusitis, recurrence not specified - complicated uri options risk benefot of antibiotic  given  sdm  URI, acute  -Patient advised to return or notify health care team  if symptoms worsen ,persist or new concerns arise.  Patient Instructions   Continue sinus hygiene     Sinusitis, Adult Sinusitis is soreness and inflammation of your sinuses. Sinuses are hollow spaces in the bones around your face. Your sinuses are located:  Around your eyes.  In the middle of your forehead.  Behind your nose.  In your cheekbones. Your sinuses and nasal passages are lined with a stringy fluid (mucus). Mucus normally drains out of your sinuses. When your nasal tissues become inflamed or swollen, the mucus can become trapped or blocked so air cannot flow through your sinuses. This allows bacteria, viruses, and funguses to grow, which leads to infection. Sinusitis can develop quickly and last for 7?10 days (acute) or for more than 12 weeks (chronic). Sinusitis often develops after a cold. What are the causes? This condition is caused by anything that creates swelling in the sinuses or stops mucus from draining, including:  Allergies.  Asthma.  Bacterial or viral infection.  Abnormally shaped bones between the nasal passages.  Nasal growths that contain mucus (nasal polyps).  Narrow sinus openings.  Pollutants, such as chemicals or irritants in the air.  A foreign object stuck in the nose.  A fungal infection. This is rare. What increases the risk? The following factors may make you more likely to develop this condition:  Having allergies or asthma.  Having had a recent cold or respiratory tract infection.  Having structural deformities or blockages in your nose or sinuses.  Having a weak immune system.  Doing a lot of swimming or diving.  Overusing nasal sprays.  Smoking. What are the signs or symptoms? The main symptoms of this condition are pain and a feeling of pressure around the affected sinuses. Other symptoms include:  Upper  toothache.  Earache.  Headache.  Bad breath.  Decreased sense of smell and taste.  A cough that may get worse at night.  Fatigue.  Fever.  Thick drainage from your nose. The drainage is often green and it may contain pus (purulent).  Stuffy nose or congestion.  Postnasal drip. This is when extra mucus collects in the throat or back of the nose.  Swelling and warmth over the affected sinuses.  Sore throat.  Sensitivity to light. How is this diagnosed? This condition is diagnosed based on symptoms, a medical history, and a physical  exam. To find out if your condition is acute or chronic, your health care provider may:  Look in your nose for signs of nasal polyps.  Tap over the affected sinus to check for signs of infection.  View the inside of your sinuses using an imaging device that has a light attached (endoscope). If your health care provider suspects that you have chronic sinusitis, you may also:  Be tested for allergies.  Have a sample of mucus taken from your nose (nasal culture) and checked for bacteria.  Have a mucus sample examined to see if your sinusitis is related to an allergy. If your sinusitis does not respond to treatment and it lasts longer than 8 weeks, you may have an MRI or CT scan to check your sinuses. These scans also help to determine how severe your infection is. In rare cases, a bone biopsy may be done to rule out more serious types of fungal sinus disease. How is this treated? Treatment for sinusitis depends on the cause and whether your condition is chronic or acute. If a virus is causing your sinusitis, your symptoms will go away on their own within 10 days. You may be given medicines to relieve your symptoms, including:  Topical nasal decongestants. They shrink swollen nasal passages and let mucus drain from your sinuses.  Antihistamines. These drugs block inflammation that is triggered by allergies. This can help to ease swelling in your  nose and sinuses.  Topical nasal corticosteroids. These are nasal sprays that ease inflammation and swelling in your nose and sinuses.  Nasal saline washes. These rinses can help to get rid of thick mucus in your nose. If your condition is caused by bacteria, you will be given an antibiotic medicine. If your condition is caused by a fungus, you will be given an antifungal medicine. Surgery may be needed to correct underlying conditions, such as narrow nasal passages. Surgery may also be needed to remove polyps. Follow these instructions at home: Medicines  Take, use, or apply over-the-counter and prescription medicines only as told by your health care provider. These may include nasal sprays.  If you were prescribed an antibiotic medicine, take it as told by your health care provider. Do not stop taking the antibiotic even if you start to feel better. Hydrate and Humidify  Drink enough water to keep your urine clear or pale yellow. Staying hydrated will help to thin your mucus.  Use a cool mist humidifier to keep the humidity level in your home above 50%.  Inhale steam for 10-15 minutes, 3-4 times a day or as told by your health care provider. You can do this in the bathroom while a hot shower is running.  Limit your exposure to cool or dry air. Rest  Rest as much as possible.  Sleep with your head raised (elevated).  Make sure to get enough sleep each night. General instructions  Apply a warm, moist washcloth to your face 3-4 times a day or as told by your health care provider. This will help with discomfort.  Wash your hands often with soap and water to reduce your exposure to viruses and other germs. If soap and water are not available, use hand sanitizer.  Do not smoke. Avoid being around people who are smoking (secondhand smoke).  Keep all follow-up visits as told by your health care provider. This is important. Contact a health care provider if:  You have a  fever.  Your symptoms get worse.  Your symptoms do not  improve within 10 days. Get help right away if:  You have a severe headache.  You have persistent vomiting.  You have pain or swelling around your face or eyes.  You have vision problems.  You develop confusion.  Your neck is stiff.  You have trouble breathing. This information is not intended to replace advice given to you by your health care provider. Make sure you discuss any questions you have with your health care provider. Document Released: 03/06/2005 Document Revised: 10/31/2015 Document Reviewed: 12/30/2014 Elsevier Interactive Patient Education  2017 [employer].      [place]. Panosh M.D.

## 2016-04-05 ENCOUNTER — Other Ambulatory Visit: Payer: Self-pay | Admitting: Internal Medicine

## 2016-04-09 NOTE — Telephone Encounter (Signed)
Ok to refill  maxalt x 2 and  lorazepam x 1

## 2016-04-10 NOTE — Telephone Encounter (Signed)
Maxalt sent electronically.  Lorazepam called and left on machine.

## 2016-08-16 ENCOUNTER — Encounter: Payer: Self-pay | Admitting: Family Medicine

## 2016-09-17 ENCOUNTER — Other Ambulatory Visit: Payer: Self-pay | Admitting: Internal Medicine

## 2016-09-18 NOTE — Telephone Encounter (Signed)
She is overdue for her yearly med check. Okay to refill 30 days worth Office visit before  runs out   And for further refills

## 2016-09-18 NOTE — Telephone Encounter (Signed)
Left a VM for patient to give the office a call back regarding making an appointment for further refills.  

## 2016-09-25 NOTE — Telephone Encounter (Signed)
Left a VM for patient to give the office a call back.  

## 2016-10-23 ENCOUNTER — Encounter: Payer: Self-pay | Admitting: Internal Medicine

## 2016-10-23 ENCOUNTER — Ambulatory Visit (INDEPENDENT_AMBULATORY_CARE_PROVIDER_SITE_OTHER): Payer: BLUE CROSS/BLUE SHIELD | Admitting: Internal Medicine

## 2016-10-23 VITALS — BP 140/90 | HR 79 | Temp 97.9°F | Wt 132.6 lb

## 2016-10-23 DIAGNOSIS — M791 Myalgia: Secondary | ICD-10-CM | POA: Diagnosis not present

## 2016-10-23 DIAGNOSIS — M545 Low back pain: Secondary | ICD-10-CM | POA: Diagnosis not present

## 2016-10-23 DIAGNOSIS — M79604 Pain in right leg: Secondary | ICD-10-CM

## 2016-10-23 DIAGNOSIS — M7918 Myalgia, other site: Secondary | ICD-10-CM

## 2016-10-23 MED ORDER — PREDNISONE 20 MG PO TABS: 20.0000 mg | ORAL_TABLET | Freq: Every day | ORAL | 0 refills | Status: DC

## 2016-10-23 MED ORDER — CYCLOBENZAPRINE HCL 5 MG PO TABS: 5.0000 mg | ORAL_TABLET | Freq: Three times a day (TID) | ORAL | 0 refills | Status: DC | PRN

## 2016-10-23 NOTE — Progress Notes (Signed)
Chief Complaint  Patient presents with  . Back Pain    HPI: Anna Poole 46 y.o.  sda      ocass strain when working out but this time felt  August 2 pm some r lbp  Pyriformis   m and then went  To gyme next day .     Hard to sleep in any position cause of pain  and numbn on right  Leg to foot mostly lateral?  at times   soing exercises and  Ice heat warm .     Feels weakl but more pain  Down leg  No prev injury    And advil every 4-6 hours    800  Twice and then  advil pm.    Not a lot of help . No bowel or  Bladder sx  No hx of similar sx  ROS: See pertinent positives and negatives per HPI.  Past Medical History:  Diagnosis Date  . Anxiety   . CARDIAC MURMUR 07/12/2007   Qualifier: Diagnosis of  By: Lawernce Ionranford, CMA (AAMA), Bethann BerkshireShannon S   . Headache(784.0)   . Heart murmur    had neg echo and ekg  . History of miscarriage 2009  . Hx of syncope    summer poss heat and hydration  . Hyperkalemia 07/08/2010  . Hypertension    Hosp 11/09 and K 3.2 neg eval, nl echo and neg aldopra ratio  . HYPOKALEMIA, HX OF 02/18/2008   Qualifier: Diagnosis of  By: Fabian SharpPanosh MD, Neta MendsWanda K   . IRREGULAR MENSES 09/06/2007   Qualifier: Diagnosis of  By: Lawernce Ionranford, CMA (AAMA), Bethann BerkshireShannon S   . Palpitations   . Panic attack 06/13/2011  . Preeclampsia    ht in pregnancy     Family History  Problem Relation Age of Onset  . Hypertension Mother   . Diabetes type II Unknown        ggf  . Stroke Unknown        pgf     Social History   Social History  . Marital status: Married    Spouse name: N/A  . Number of children: N/A  . Years of education: N/A   Social History Main Topics  . Smoking status: Never Smoker  . Smokeless tobacco: Never Used  . Alcohol use Yes  . Drug use: No  . Sexual activity: Not Asked   Other Topics Concern  . 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 escitalopram (LEXAPRO) 10 MG tablet TAKE 1 TABLET DAILY 30 tablet 0  . GENERESS FE 0.8-25 MG-MCG tablet Chew 1 tablet by mouth daily.     Marland Kitchen. lisinopril (PRINIVIL,ZESTRIL) 10 MG tablet TAKE ONE-HALF TO ONE TABLET DAILY 90 tablet 1  . LORazepam (ATIVAN) 0.5 MG tablet TAKE 1 OR 2 TABLETS BY MOUTH EVERY 8 HOURS AS NEEDED FOR ANXIETY 20 tablet 0  . rizatriptan (MAXALT-MLT) 10 MG disintegrating tablet DISSOLVE ONE TABLET BY MOUTH AS NEEDED FOR MIGRAINE. REPEAT IN 2 HOURS IF NEEDED. 10 tablet 1  . amoxicillin-clavulanate (AUGMENTIN) 875-125 MG tablet Take 1 tablet by mouth 2 (two) times  daily. 14 tablet 0   No facility-administered medications prior to visit.      EXAM:  BP 140/90 (BP Location: Right Arm, Patient Position: Sitting, Cuff Size: Normal)   Pulse 79   Temp 97.9 F (36.6 C) (Oral)   Wt 132 lb 9.6 oz (60.1 kg)   BMI 23.12 kg/m   Body mass index is 23.12 kg/m.  GENERAL: vitals reviewed and listed above, alert, oriented, appears well hydrated and in mild  acute distress limp moving sometimes  HEENT: atraumatic, conjunctiva  clear, no obvious abnormalities on inspection of external nose and ears NECK: no obvious masses on inspection palpation   CV: HRRR, no clubbing cyanosis or  peripheral edema nl cap refill  MS: moves all extremities without noticeable focal  Abnormality  No mid line tenderness    Pain right buttocks area and ? Pos slr  dtrs intact bilaterally some pain vs weakness right dorsiflexion   No acute findings otherwise  PSYCH: pleasant and cooperative, uncomfortable in certain positions   ASSESSMENT AND PLAN:  Discussed the following assessment and plan:  Low back pain radiating to right leg  Right buttock pain More consistent with sciatica may not be puriform his could be from spine no acute weakness although his generally favoring her right leg  question SLR but reflexes are intact. Failed time and oral NSAIDs. Prednisone taper cautious use of occasional muscle relaxant at night if needed. Expectant management if not improving or ongoing relapsing consideration of sports medicine imaging etc. She will let us know follow-up in 2-3 weeks if needed. Offered work accommodation she is going to try to work tomorrow she is off today. -Patient advised to return or notify health care team  if symptoms worsen ,persist or new concerns arise.  Patient Instructions    This could be    A pinched nerve in your spine  Vs other causes of sciatica.    Vs pyriformis issue .    Prednisone  Taper.  And      Muscle  Relaxant  At night for a night or so .   Plan rov in about 2 weeks if not a lot better   consdier seeing sprots medicine about   Adaptation  rx of   Pain . Marland Kitchen   Advice on fu exercise plan    Sciatica Sciatica is pain, numbness, weakness, or tingling along the path of the sciatic nerve. The sciatic nerve starts in the lower back and runs down the back of each leg. The nerve controls the muscles in the lower leg and in the back of the knee. It also provides feeling (sensation) to the back of the thigh, the lower leg, and the sole of the foot. Sciatica is a symptom of another medical condition that pinches or puts pressure on the sciatic nerve. Generally, sciatica only affects one side of the body. Sciatica usually goes away on its own or with treatment. In some cases, sciatica may keep coming back (recur). What are the causes? This condition is caused by pressure on the sciatic nerve, or pinching of the sciatic nerve. This may be the result of:  A disk in between the bones of the spine (vertebrae) bulging out too far (herniated disk).  Age-related changes in the spinal disks (degenerative disk disease).  A pain disorder that affects a muscle in the buttock (piriformis syndrome).  Extra bone growth (bone spur) near the sciatic nerve.  An  injury or break (fracture) of the pelvis.  Pregnancy.  Tumor (rare).  What increases the risk? The following factors may make you more likely to develop this condition:  Playing sports that place pressure or stress on the spine, such as football or weight lifting.  Having poor strength and flexibility.  A history of back injury.  A history of back surgery.  Sitting for long periods of time.  Doing activities that involve repetitive bending or lifting.  Obesity.  What are the signs or symptoms? Symptoms can vary from mild to very severe, and they may include:  Any of these problems in the lower back, leg, hip, or buttock: ? Mild tingling or dull aches. ? Burning sensations. ? Sharp pains.  Numbness in the back of the calf or the sole of the foot.  Leg weakness.  Severe back pain that makes movement difficult.  These symptoms may get worse when you cough, sneeze, or laugh, or when you sit or stand for long periods of time. Being overweight may also make symptoms worse. In some cases, symptoms may recur over time. How is this diagnosed? This condition may be diagnosed based on:  Your symptoms.  A physical exam. Your health care provider may ask you to do certain movements to check whether those movements trigger your symptoms.  You may have tests, including: ? Blood tests. ? X-rays. ? MRI. ? CT scan.  How is this treated? In many cases, this condition improves on its own, without any treatment. However, treatment may include:  Reducing or modifying physical activity during periods of pain.  Exercising and stretching to strengthen your abdomen and improve the flexibility of your spine.  Icing and applying heat to the affected area.  Medicines that help: ? To relieve pain and swelling. ? To relax your muscles.  Injections of medicines that help to relieve pain, irritation, and inflammation around the sciatic nerve (steroids).  Surgery.  Follow these  instructions at home: Medicines  Take over-the-counter and prescription medicines only as told by your health care provider.  Do not drive or operate heavy machinery while taking prescription pain medicine. Managing pain  If directed, apply ice to the affected area. ? Put ice in a plastic bag. ? Place a towel between your skin and the bag. ? Leave the ice on for 20 minutes, 2-3 times a day.  After icing, apply heat to the affected area before you exercise or as often as told by your health care provider. Use the heat source that your health care provider recommends, such as a moist heat pack or a heating pad. ? Place a towel between your skin and the heat source. ? Leave the heat on for 20-30 minutes. ? Remove the heat if your skin turns bright red. This is especially important if you are unable to feel pain, heat, or cold. You may have a greater risk of getting burned. Activity  Return to your normal activities as told by your health care provider. Ask your health care provider what activities are safe for you. ? Avoid activities that make your symptoms worse.  Take brief periods of rest throughout the day. Resting in a lying or standing position is usually better than sitting to rest. ? When you rest for longer periods, mix in some mild activity or stretching between periods of rest. This will help to prevent stiffness and pain. ? Avoid sitting for long periods of time without moving. Get up and move around at least one time each hour.  Exercise and stretch regularly, as told  by your health care provider.  Do not lift anything that is heavier than 10 lb (4.5 kg) while you have symptoms of sciatica. When you do not have symptoms, you should still avoid heavy lifting, especially repetitive heavy lifting.  When you lift objects, always use proper lifting technique, which includes: ? Bending your knees. ? Keeping the load close to your body. ? Avoiding twisting. General  instructions  Use good posture. ? Avoid leaning forward while sitting. ? Avoid hunching over while standing.  Maintain a healthy weight. Excess weight puts extra stress on your back and makes it difficult to maintain good posture.  Wear supportive, comfortable shoes. Avoid wearing high heels.  Avoid sleeping on a mattress that is too soft or too hard. A mattress that is firm enough to support your back when you sleep may help to reduce your pain.  Keep all follow-up visits as told by your health care provider. This is important. Contact a health care provider if:  You have pain that wakes you up when you are sleeping.  You have pain that gets worse when you lie down.  Your pain is worse than you have experienced in the past.  Your pain lasts longer than 4 weeks.  You experience unexplained weight loss. Get help right away if:  You lose control of your bowel or bladder (incontinence).  You have: ? Weakness in your lower back, pelvis, buttocks, or legs that gets worse. ? Redness or swelling of your back. ? A burning sensation when you urinate. This information is not intended to replace advice given to you by your health care provider. Make sure you discuss any questions you have with your health care provider. Document Released: 02/28/2001 Document Revised: 08/10/2015 Document Reviewed: 11/13/2014 Elsevier Interactive Patient Education  2017 [employer].     [place]. Declynn Lopresti M.D.

## 2016-10-23 NOTE — Patient Instructions (Addendum)
This could be    A pinched nerve in your spine  Vs other causes of sciatica.    Vs pyriformis issue .    Prednisone  Taper.  And      Muscle  Relaxant  At night for a night or so .   Plan rov in about 2 weeks if not a lot better   consdier seeing sprots medicine about   Adaptation  rx of   Pain . Anna Poole   Advice on fu exercise plan    Sciatica Sciatica is pain, numbness, weakness, or tingling along the path of the sciatic nerve. The sciatic nerve starts in the lower back and runs down the back of each leg. The nerve controls the muscles in the lower leg and in the back of the knee. It also provides feeling (sensation) to the back of the thigh, the lower leg, and the sole of the foot. Sciatica is a symptom of another medical condition that pinches or puts pressure on the sciatic nerve. Generally, sciatica only affects one side of the body. Sciatica usually goes away on its own or with treatment. In some cases, sciatica may keep coming back (recur). What are the causes? This condition is caused by pressure on the sciatic nerve, or pinching of the sciatic nerve. This may be the result of:  A disk in between the bones of the spine (vertebrae) bulging out too far (herniated disk).  Age-related changes in the spinal disks (degenerative disk disease).  A pain disorder that affects a muscle in the buttock (piriformis syndrome).  Extra bone growth (bone spur) near the sciatic nerve.  An injury or break (fracture) of the pelvis.  Pregnancy.  Tumor (rare).  What increases the risk? The following factors may make you more likely to develop this condition:  Playing sports that place pressure or stress on the spine, such as football or weight lifting.  Having poor strength and flexibility.  A history of back injury.  A history of back surgery.  Sitting for long periods of time.  Doing activities that involve repetitive bending or lifting.  Obesity.  What are the signs or  symptoms? Symptoms can vary from mild to very severe, and they may include:  Any of these problems in the lower back, leg, hip, or buttock: ? Mild tingling or dull aches. ? Burning sensations. ? Sharp pains.  Numbness in the back of the calf or the sole of the foot.  Leg weakness.  Severe back pain that makes movement difficult.  These symptoms may get worse when you cough, sneeze, or laugh, or when you sit or stand for long periods of time. Being overweight may also make symptoms worse. In some cases, symptoms may recur over time. How is this diagnosed? This condition may be diagnosed based on:  Your symptoms.  A physical exam. Your health care provider may ask you to do certain movements to check whether those movements trigger your symptoms.  You may have tests, including: ? Blood tests. ? X-rays. ? MRI. ? CT scan.  How is this treated? In many cases, this condition improves on its own, without any treatment. However, treatment may include:  Reducing or modifying physical activity during periods of pain.  Exercising and stretching to strengthen your abdomen and improve the flexibility of your spine.  Icing and applying heat to the affected area.  Medicines that help: ? To relieve pain and swelling. ? To relax your muscles.  Injections of medicines that  help to relieve pain, irritation, and inflammation around the sciatic nerve (steroids).  Surgery.  Follow these instructions at home: Medicines  Take over-the-counter and prescription medicines only as told by your health care provider.  Do not drive or operate heavy machinery while taking prescription pain medicine. Managing pain  If directed, apply ice to the affected area. ? Put ice in a plastic bag. ? Place a towel between your skin and the bag. ? Leave the ice on for 20 minutes, 2-3 times a day.  After icing, apply heat to the affected area before you exercise or as often as told by your health care  provider. Use the heat source that your health care provider recommends, such as a moist heat pack or a heating pad. ? Place a towel between your skin and the heat source. ? Leave the heat on for 20-30 minutes. ? Remove the heat if your skin turns bright red. This is especially important if you are unable to feel pain, heat, or cold. You may have a greater risk of getting burned. Activity  Return to your normal activities as told by your health care provider. Ask your health care provider what activities are safe for you. ? Avoid activities that make your symptoms worse.  Take brief periods of rest throughout the day. Resting in a lying or standing position is usually better than sitting to rest. ? When you rest for longer periods, mix in some mild activity or stretching between periods of rest. This will help to prevent stiffness and pain. ? Avoid sitting for long periods of time without moving. Get up and move around at least one time each hour.  Exercise and stretch regularly, as told by your health care provider.  Do not lift anything that is heavier than 10 lb (4.5 kg) while you have symptoms of sciatica. When you do not have symptoms, you should still avoid heavy lifting, especially repetitive heavy lifting.  When you lift objects, always use proper lifting technique, which includes: ? Bending your knees. ? Keeping the load close to your body. ? Avoiding twisting. General instructions  Use good posture. ? Avoid leaning forward while sitting. ? Avoid hunching over while standing.  Maintain a healthy weight. Excess weight puts extra stress on your back and makes it difficult to maintain good posture.  Wear supportive, comfortable shoes. Avoid wearing high heels.  Avoid sleeping on a mattress that is too soft or too hard. A mattress that is firm enough to support your back when you sleep may help to reduce your pain.  Keep all follow-up visits as told by your health care provider.  This is important. Contact a health care provider if:  You have pain that wakes you up when you are sleeping.  You have pain that gets worse when you lie down.  Your pain is worse than you have experienced in the past.  Your pain lasts longer than 4 weeks.  You experience unexplained weight loss. Get help right away if:  You lose control of your bowel or bladder (incontinence).  You have: ? Weakness in your lower back, pelvis, buttocks, or legs that gets worse. ? Redness or swelling of your back. ? A burning sensation when you urinate. This information is not intended to replace advice given to you by your health care provider. Make sure you discuss any questions you have with your health care provider. Document Released: 02/28/2001 Document Revised: 08/10/2015 Document Reviewed: 11/13/2014 Elsevier Interactive Patient Education  2017  [employer].

## 2016-10-31 ENCOUNTER — Encounter: Payer: Self-pay | Admitting: Family Medicine

## 2016-10-31 ENCOUNTER — Other Ambulatory Visit: Payer: Self-pay | Admitting: Emergency Medicine

## 2016-10-31 ENCOUNTER — Ambulatory Visit (INDEPENDENT_AMBULATORY_CARE_PROVIDER_SITE_OTHER): Payer: BLUE CROSS/BLUE SHIELD | Admitting: Family Medicine

## 2016-10-31 ENCOUNTER — Telehealth: Payer: Self-pay | Admitting: Internal Medicine

## 2016-10-31 VITALS — BP 120/78 | HR 89 | Temp 98.1°F | Wt 132.4 lb

## 2016-10-31 DIAGNOSIS — M5417 Radiculopathy, lumbosacral region: Secondary | ICD-10-CM

## 2016-10-31 MED ORDER — CYCLOBENZAPRINE HCL 10 MG PO TABS: 10.0000 mg | ORAL_TABLET | Freq: Three times a day (TID) | ORAL | 0 refills | Status: DC | PRN

## 2016-10-31 NOTE — Patient Instructions (Addendum)

## 2016-10-31 NOTE — Progress Notes (Signed)
Subjective:    Patient ID: Anna Poole, female    DOB: 08/23/1970, 46 y.o.   MRN: [id number]  Chief Complaint  Patient presents with  . Tailbone Pain    HPI Patient is seen today for acute complaint.  Previously seen on 10/23/16 for low back pain radiating down right LE.  Patient states last Wednesday when tension and work down on the abductor abductor thigh machine.  On that Friday morning she felt pain in her right but right lower extremity.  She was seen in the office on 8/6 and given Flexeril and a steroid taper.  Patient states she felt better for the first 3 days of the steroid taper but then began to note symptoms.  Pt also notes she was active over the weekend, went kayaking.  Patient describes symptoms as right lower extremity pain and weakness.  Notes difficulty standing.  Pain described as a constant tug, when muscle spasms notes 8 out of 10 pain.  Pt has tried ice, heat, massage, stretching exercises, and using a tennis ball with no relief.  Pt no taking Flexeril BID with no relief.     Pt unable to tell if pain is in low back or in butt at this time.  Past Medical History:  Diagnosis Date  . Anxiety   . CARDIAC MURMUR 07/12/2007   Qualifier: Diagnosis of  By: Lawernce Ionranford, CMA (AAMA), Bethann BerkshireShannon S   . Headache(784.0)   . Heart murmur    had neg echo and ekg  . History of miscarriage 2009  . Hx of syncope    summer poss heat and hydration  . Hyperkalemia 07/08/2010  . Hypertension    Hosp 11/09 and K 3.2 neg eval, nl echo and neg aldopra ratio  . HYPOKALEMIA, HX OF 02/18/2008   Qualifier: Diagnosis of  By: Fabian SharpPanosh MD, Neta MendsWanda K   . IRREGULAR MENSES 09/06/2007   Qualifier: Diagnosis of  By: Lawernce Ionranford, CMA (AAMA), Bethann BerkshireShannon S   . Palpitations   . Panic attack 06/13/2011  . Preeclampsia    ht in pregnancy     Past Surgical History:  Procedure Laterality Date  . OVARIAN CYST REMOVAL  1991   [facility]    Family History  Problem Relation Age of Onset  . Hypertension Mother   .  Diabetes type II Unknown        ggf  . Stroke Unknown        pgf     Social History   Social History  . Marital status: Married    Spouse name: N/A  . Number of children: N/A  . Years of education: N/A   Occupational History  . Not on file.   Social History Main Topics  . Smoking status: Never Smoker  . Smokeless tobacco: Never Used  . Alcohol use Yes  . Drug use: No  . Sexual activity: Not on file   Other Topics Concern  . 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 escitalopram (LEXAPRO) 10 MG tablet TAKE 1 TABLET DAILY 30 tablet 0  . GENERESS FE 0.8-25 MG-MCG tablet Chew 1 tablet by  mouth daily.     Marland Kitchen lisinopril (PRINIVIL,ZESTRIL) 10 MG tablet TAKE ONE-HALF TO ONE TABLET DAILY 90 tablet 1  . LORazepam (ATIVAN) 0.5 MG tablet TAKE 1 OR 2 TABLETS BY MOUTH EVERY 8 HOURS AS NEEDED FOR ANXIETY 20 tablet 0  . predniSONE (DELTASONE) 20 MG tablet Take 1 tablet (20 mg total) by mouth daily. Take 3,3,3,2,2,2,1,1,1, 1/.2 1./2 1/.2 pills qd 24 tablet 0  . rizatriptan (MAXALT-MLT) 10 MG disintegrating tablet DISSOLVE ONE TABLET BY MOUTH AS NEEDED FOR MIGRAINE. REPEAT IN 2 HOURS IF NEEDED. 10 tablet 1  . cyclobenzaprine (FLEXERIL) 5 MG tablet Take 1 tablet (5 mg total) by mouth 3 (three) times daily as needed for muscle spasms. 21 tablet 0   No facility-administered medications prior to visit.     No Known Allergies  ROS  General: Denies fever, chills, night sweats, changes in weight, changes in appetite HEENT: Denies headaches, ear pain, changes in vision, rhinorrhea, sore throat CV: Denies CP, palpitations, SOB, orthopnea Pulm: Denies SOB, cough, wheezing GI: Denies abdominal pain, nausea, vomiting, diarrhea, constipation GU: Denies  dysuria, hematuria, frequency, vaginal discharge.  Denies loss of bowel or bladder. Msk: Endorses muscle cramp in R buttock Neuro: Endorses weakness and pain in R buttock, and RLE Skin: Denies rashes, bruising Psych: Denies depression, anxiety, hallucinations      Objective:    Blood pressure 120/78, pulse 89, temperature 98.1 F (36.7 C), temperature source Oral, weight 132 lb 6.4 oz (60.1 kg).      Gen. Pleasant, well-nourished, in no distress, normal affect.  Vitals (BP and pulse) initially elevated. HEENT -[place]/AT, no lesions, face symmetric, no scleral icterus, PERRLA, no post nasal drip Lungs: no accessory muscle uss, CTAB, no wheezes or rales Cardiovascular: RR, heart sounds  normal, no m/r/g, no peripheral edema Abdomen: soft and non-tender, no hepatosplenomegaly, BS normal. Musculoskeletal: No deformities, no cyanosis or clubbing, normal tone.  No TTP of cervical, thoracic, lumbar, sacral spine. No tenderness to palpation of paraspinal muscles. No tenderness to deep palpation of gluteal muscles laterally. Instability noted while standing on right leg. No instability while standing on left leg. Positive straight leg raise on the right. Pain relieved by bending right knee.  Some right lower back and right buttock pain with raising of left leg to approximately 45.  Negative logroll. Neuro:  A&Ox3, CN II-XII intact, right lower extremity to 4 out of 5 strength, left lower extremity 5 out of 5 strength.  Dorsiflexion of R foot 4/5 strength, Dorsiflexion of L foot 5/5.   Skin:  Warm, no lesions/ rash   Wt Readings from Last 3 Encounters:  10/31/16 132 lb 6.4 oz (60.1 kg)  10/23/16 132 lb 9.6 oz (60.1 kg)  03/21/16 127 lb (57.6 kg)    Diabetic Foot Exam - Simple   No data filed     Lab Results  Component Value Date   WBC 5.9 08/09/2015   HGB 12.3 08/09/2015   HCT 36.1 08/09/2015   PLT 239.0 08/09/2015   GLUCOSE 77 08/09/2015   GLUCOSE 77 08/09/2015   CHOL 221 (H) 08/09/2015    TRIG 110.0 08/09/2015   HDL 49.30 08/09/2015   LDLDIRECT 166.2 03/07/2013   LDLCALC 149 (H) 08/09/2015   ALT 15 08/09/2015   AST 16 08/09/2015   NA 137 08/09/2015   NA 137 08/09/2015   K 3.6 08/09/2015   K 3.6 08/09/2015   CL 104 08/09/2015   CL 104 08/09/2015   CREATININE 0.68 08/09/2015   CREATININE  0.68 08/09/2015   BUN 12 08/09/2015   BUN 12 08/09/2015   CO2 25 08/09/2015   CO2 25 08/09/2015   TSH 2.10 08/09/2015   INR 1.1 02/12/2008   HGBA1C  02/12/2008    5.2 (NOTE)   The ADA recommends the following therapeutic goal for glycemic   control related to Hgb A1C measurement:   Goal of Therapy:   < 7.0% Hgb A1C   Reference: American Diabetes Association: Clinical Practice   Recommendations 2008, Diabetes Care,  2008, 31:(Suppl 1).    Assessment/Plan:  Lumbosacral radiculopathy -  Discussed piriformis syndrome vs lumbar back pain as unable to illicit pain on exam.  Given h/o RLE weakness will proceed with imaging.  Discussed increasing Flexaril dose to 10 mg.  Will take 10 mg at night to see how the medication makes pt feel.  Discussed increasing to BID if needed at the 10 mg dose.  Discussed red flag symptoms including but not limited to fever, chills, loss of bowel or bladder.  Advised to avoid EtOH while on muscle relaxer. Will take Aleeve for pain. Given handout on radiculopathy. Advised to notify clinic or proceed to ED if symptoms become worse.  Plan: MR Lumbar Spine Wo Contrast, CANCELED: DG Lumbar Spine Complete

## 2016-10-31 NOTE — Telephone Encounter (Signed)
Patient has been scheduled to see Dr. Salomon FickBanks

## 2016-10-31 NOTE — Telephone Encounter (Signed)
Pt calling stating that the pain in her back is back since the tapering down off of the prednisone and Dr. Fabian SharpPanosh told her to give the office a call back when or if there were no changes. Pt would like to know what she should do from here.

## 2016-11-01 ENCOUNTER — Ambulatory Visit
Admission: RE | Admit: 2016-11-01 | Discharge: 2016-11-01 | Disposition: A | Discharge status: Home / Self Care | Payer: BLUE CROSS/BLUE SHIELD | Source: Ambulatory Visit | Attending: Family Medicine | Admitting: Family Medicine

## 2016-11-01 DIAGNOSIS — M5417 Radiculopathy, lumbosacral region: Secondary | ICD-10-CM

## 2016-11-02 ENCOUNTER — Telehealth: Payer: Self-pay | Admitting: Family Medicine

## 2016-11-02 DIAGNOSIS — M5126 Other intervertebral disc displacement, lumbar region: Secondary | ICD-10-CM

## 2016-11-02 NOTE — Telephone Encounter (Signed)
Called pt to inform her of MRI results.  Impression: large herniated disc L5/S1.  Discussed options with pt.  Pt states does not want surgery but is willing to see Ortho for opinion.  Would like to do PT if possible.  Will place urgent referral to Ortho for opinion on treatment options.

## 2016-11-07 ENCOUNTER — Other Ambulatory Visit: Payer: Self-pay | Admitting: Neurological Surgery

## 2016-11-07 DIAGNOSIS — M5126 Other intervertebral disc displacement, lumbar region: Secondary | ICD-10-CM

## 2016-11-16 ENCOUNTER — Ambulatory Visit
Admission: RE | Admit: 2016-11-16 | Discharge: 2016-11-16 | Disposition: A | Discharge status: Home / Self Care | Payer: BLUE CROSS/BLUE SHIELD | Source: Ambulatory Visit | Attending: Neurological Surgery | Admitting: Neurological Surgery

## 2016-11-16 DIAGNOSIS — M5126 Other intervertebral disc displacement, lumbar region: Secondary | ICD-10-CM

## 2016-11-16 MED ORDER — METHYLPREDNISOLONE ACETATE 40 MG/ML INJ SUSP (RADIOLOG
120.0000 mg | Freq: Once | INTRAMUSCULAR | Status: AC
  Administered 2016-11-16: 120 mg via EPIDURAL

## 2016-11-16 MED ORDER — IOPAMIDOL (ISOVUE-M 200) INJECTION 41%
1.0000 mL | Freq: Once | INTRAMUSCULAR | Status: AC
  Administered 2016-11-16: 1 mL via EPIDURAL

## 2016-11-16 NOTE — Discharge Instructions (Signed)

## 2016-11-21 ENCOUNTER — Other Ambulatory Visit: Payer: Self-pay | Admitting: Internal Medicine

## 2016-12-08 ENCOUNTER — Other Ambulatory Visit: Payer: Self-pay | Admitting: *Deleted

## 2016-12-08 ENCOUNTER — Encounter: Payer: Self-pay | Admitting: Internal Medicine

## 2016-12-08 MED ORDER — ESCITALOPRAM OXALATE 10 MG PO TABS: 10.0000 mg | ORAL_TABLET | Freq: Every day | ORAL | 0 refills | Status: DC

## 2016-12-08 NOTE — Telephone Encounter (Signed)
Okay per Dr Fabian Sharp for 90-day supply.  Last seen 10/23/16 Last filled 09/27/16 Upcoming OV 01/10/17  E-Scribed to [facility] [facility] [facility] [employer].   Nothing further needed.

## 2017-01-08 NOTE — Progress Notes (Signed)
Chief Complaint  Patient presents with  . Annual Exam    Pt states not feeling well today, low grade temp 99.0, sneezing and headache x 2 days. Feels like she is getting sick.     HPI: Patient  Anna Poole  46 y.o. comes in today for [place] visit   bp  Not checking   Taking  1/2 bp meds   A week or so she has been taking the 10 mg every day.  No side effects. Not exercising as much since her back condition occurred but she is back on the treadmill recently. She does have a mild illness today see above. She is somewhat anxious today and fasting. Sees a specialist for her migraines although she is on continuous therapy still seems to be cyclical. This runs in her family.  Health Maintenance  Topic Date Due  . HIV Screening  01/20/1986  . INFLUENZA VACCINE  06/17/2017 (Originally 10/18/2016)  . TETANUS/TDAP  07/11/2017  . MAMMOGRAM  08/15/2017  . PAP SMEAR  04/04/2018   Health Maintenance Review LIFESTYLE:  Exercise:  Not recnetly  3 mos    Back pain and now statrgin  Treadmill.  Tobacco/ETS: no Alcohol:  ocass Sugar beverages:not oftten Sleep: 6-8  Drug use: no   HH of  4   1 dog.  Work: 37-39    ROS:  GEN/ HEENT: No fever, significant weight changes sweats  vision problems hearing changes, CV/ PULM; No chest pain shortness of breath cough, syncope,edema  change in exercise tolerance. GI /GU: No adominal pain, vomiting, change in bowel habits. No blood in the stool. No significant GU symptoms. SKIN/HEME: ,no acute skin rashes suspicious lesions or bleeding. No lymphadenopathy, nodules, masses.  NEURO/ PSYCH:  No neurologic signs such as weakness numbness. No depression anxiety. IMM/ Allergy: No unusual infections.  Allergy .   REST of 12 system review negative except as per HPI   Past Medical History:  Diagnosis Date  . Anxiety   . CARDIAC MURMUR 07/12/2007   Qualifier: Diagnosis of  By: Hulan Saas, CMA (AAMA), [place] Skye   . Headache(784.0)   .  Heart murmur    had neg echo and ekg  . History of miscarriage 2009  . Hx of syncope    summer poss heat and hydration  . Hyperkalemia 07/08/2010  . Hypertension    Hosp 11/09 and K 3.2 neg eval, nl echo and neg aldopra ratio  . HYPOKALEMIA, HX OF 02/18/2008   Qualifier: Diagnosis of  By: Regis Bill MD, Standley Brooking   . IRREGULAR MENSES 09/06/2007   Qualifier: Diagnosis of  By: Hulan Saas, CMA (AAMA), [place]a Skye   . Palpitations   . Panic attack 06/13/2011  . Preeclampsia    ht in pregnancy     Past Surgical History:  Procedure Laterality Date  . OVARIAN CYST REMOVAL  1991   [facility]    Family History  Problem Relation Age of Onset  . Hypertension Mother   . Diabetes type II Unknown        ggf  . Stroke Unknown        pgf     Social History   Social History  . Marital status: Married    Spouse name: N/A  . Number of children: N/A  . Years of education: N/A   Social History Main Topics  . Smoking status: Never Smoker  . Smokeless tobacco: Never Used  . Alcohol use Yes  . Drug use: No  .  Sexual activity: Not Asked   Other Topics Concern  . 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 escitalopram (LEXAPRO) 10 MG tablet Take 1 tablet (10 mg total) by mouth daily. 90 tablet 0  . GENERESS FE 0.8-25 MG-MCG tablet Chew 1 tablet by mouth daily.     Marland Kitchen lisinopril (PRINIVIL,ZESTRIL) 10 MG tablet TAKE ONE-HALF TO ONE TABLET DAILY 90 tablet 1  . LORazepam (ATIVAN) 0.5 MG tablet TAKE 1 OR 2 TABLETS BY MOUTH EVERY 8 HOURS AS NEEDED FOR ANXIETY 20 tablet 0  . rizatriptan (MAXALT-MLT) 10 MG disintegrating tablet DISSOLVE ONE TABLET BY MOUTH AS NEEDED FOR MIGRAINE. REPEAT IN 2 HOURS IF NEEDED. 10 tablet 1  . cyclobenzaprine (FLEXERIL) 10 MG tablet Take 1  tablet (10 mg total) by mouth 3 (three) times daily as needed for muscle spasms. (Patient not taking: Reported on 01/10/2017) 30 tablet 0  . predniSONE (DELTASONE) 20 MG tablet Take 1 tablet (20 mg total) by mouth daily. Take 3,3,3,2,2,2,1,1,1, 1/.2 1./2 1/.2 pills qd (Patient not taking: Reported on 01/10/2017) 24 tablet 0   No facility-administered medications prior to visit.      EXAM:  BP (!) 146/80 (BP Location: Right Arm)   Pulse (!) 121   Temp 99 F (37.2 C) (Oral)   Ht 5' 2.75" (1.594 m)   Wt 137 lb 8 oz (62.4 kg)   BMI 24.55 kg/m   Body mass index is 24.55 kg/m. Wt Readings from Last 3 Encounters:  01/10/17 137 lb 8 oz (62.4 kg)  10/31/16 132 lb 6.4 oz (60.1 kg)  10/23/16 132 lb 9.6 oz (60.1 kg)    Physical Exam: Vital signs reviewed ZDG:LOVF is a well-developed well-nourished alert cooperative    who appearsr stated age in no acute distress.  HEENT: normocephalic atraumatic , Eyes: PERRL EOM's full, conjunctiva clear, Nares: paten,t no deformity discharge or tenderness., Ears: no deformity EAC's clear TMs with normal landmarks. Mouth: clear OP, no lesions, edema.  Moist mucous membranes. Dentition in adequate repair. NECK: supple without masses, thyromegaly or bruits. CHEST/PULM:  Clear to auscultation and percussion breath sounds equal no wheeze , rales or rhonchi. No chest wall deformities or tenderness.  CV: PMI is nondisplaced, S1 S2 no gallops, murmurs, rubs. Peripheral pulses are full without delay.No JVD .  ABDOMEN: Bowel sounds normal nontender  No guard or rebound, no hepato splenomegal no CVA tenderness.  No hernia. Extremtities:  No clubbing cyanosis or edema, no acute joint swelling or redness no focal atrophy NEURO:  Oriented x3, cranial nerves 3-12 appear to be intact, no obvious focal weakness,gait within normal limits no abnormal reflexes or asymmetrical SKIN: No acute rashes normal turgor, color, no bruising or petechiae. PSYCH: Oriented, good eye  contact, no obvious depression anxiety, cognition and judgment appear normal. LN: no cervical axillary inguinal adenopathy  Lab Results  Component Value Date   WBC 5.7 01/10/2017   HGB 13.4 01/10/2017   HCT 40.1 01/10/2017   PLT 250.0 01/10/2017   GLUCOSE 88 01/10/2017   CHOL 256 (H) 01/10/2017   TRIG 145.0 01/10/2017   HDL 55.90 01/10/2017   LDLDIRECT  166.2 03/07/2013   LDLCALC 171 (H) 01/10/2017   ALT 11 01/10/2017   AST 15 01/10/2017   NA 138 01/10/2017   K 4.1 01/10/2017   CL 104 01/10/2017   CREATININE 0.66 01/10/2017   BUN 9 01/10/2017   CO2 26 01/10/2017   TSH 1.44 01/10/2017   INR 1.1 02/12/2008   HGBA1C  02/12/2008    5.2 (NOTE)   The ADA recommends the following therapeutic goal for glycemic   control related to Hgb A1C measurement:   Goal of Therapy:   < 7.0% Hgb A1C   Reference: American Diabetes Association: Clinical Practice   Recommendations 2008, Diabetes Care,  2008, 31:(Suppl 1).    BP Readings from Last 3 Encounters:  01/10/17 (!) 146/80  11/16/16 (!) 155/92  10/31/16 120/78   Wt Readings from Last 3 Encounters:  01/10/17 137 lb 8 oz (62.4 kg)  10/31/16 132 lb 6.4 oz (60.1 kg)  10/23/16 132 lb 9.6 oz (60.1 kg)      ASSESSMENT AND PLAN:  Discussed the following assessment and plan:  Visit for preventive health examination - Plan: Basic metabolic panel, CBC with Differential/Platelet, Hepatic function panel, Lipid panel, TSH  Essential hypertension - Plan: Basic metabolic panel, CBC with Differential/Platelet, Hepatic function panel, Lipid panel, TSH  Medication management - Plan: Basic metabolic panel, CBC with Differential/Platelet, Hepatic function panel, Lipid panel, TSH  Recurrent headache - Plan: Basic metabolic panel, CBC with Differential/Platelet, Hepatic function panel, Lipid panel, TSH  Hyperlipidemia, unspecified hyperlipidemia type - Plan: Basic metabolic panel, CBC with Differential/Platelet, Hepatic function panel, Lipid panel,  TSH  Anxiety state     Fasting lab work today Let her know results when available Blood pressure may be up related to lack of activity recently anxiety component. Healthcare maintenance reviewed. She will send [place] blood pressure readings on my chart under better circumstances to decide on management.  There is definitely white coat component but she is on low-dose hormones   Mild URI uncomplicated. Patient Care Team: Burnis Medin, MD as PCP - General Paula Compton, MD (Obstetrics and Gynecology) Amil Amen, MD as Referring Physician (Psychiatry) Patient Instructions   Check blood pressure readings twice a day for 3-5 days and send in the readings on my chart.  At this point stay on same medication.  Attempt to lifestyle health eating and exercise.  We can decide next step depending on blood pressure control.     Preventive Care 40-64 Years, Female Preventive care refers to lifestyle choices and visits with your health care provider that can promote health and wellness. What does preventive care include?  A yearly physical exam. This is also called an annual well check.  Dental exams once or twice a year.  Routine eye exams. Ask your health care provider how often you should have your eyes checked.  Personal lifestyle choices, including: ? Daily care of your teeth and gums. ? Regular physical activity. ? Eating a healthy diet. ? Avoiding tobacco and drug use. ? Limiting alcohol use. ? Practicing safe sex. ? Taking low-dose aspirin daily starting at age 108. ? Taking vitamin and mineral supplements as recommended by your health care provider. What happens during an annual well check? The services and screenings done by your health care provider during your annual well check will depend on your age, overall health, lifestyle risk factors, and family history of disease. Counseling Your health care provider may ask you questions about your:  Alcohol  use.  Tobacco use.  Drug use.  Emotional well-being.  Home and relationship well-being.  Sexual activity.  Eating habits.  Work and work Statistician.  Method of birth control.  Menstrual cycle.  Pregnancy history.  Screening You may have the following tests or measurements:  Height, weight, and BMI.  Blood pressure.  Lipid and cholesterol levels. These may be checked every 5 years, or more frequently if you are over 11 years old.  Skin check.  Lung cancer screening. You may have this screening every year starting at age 20 if you have a 30-pack-year history of smoking and currently smoke or have quit within the past 15 years.  Fecal occult blood test (FOBT) of the stool. You may have this test every year starting at age 41.  Flexible sigmoidoscopy or colonoscopy. You may have a sigmoidoscopy every 5 years or a colonoscopy every 10 years starting at age 22.  Hepatitis C blood test.  Hepatitis B blood test.  Sexually transmitted disease (STD) testing.  Diabetes screening. This is done by checking your blood sugar (glucose) after you have not eaten for a while (fasting). You may have this done every 1-3 years.  Mammogram. This may be done every 1-2 years. Talk to your health care provider about when you should start having regular mammograms. This may depend on whether you have a family history of breast cancer.  BRCA-related cancer screening. This may be done if you have a family history of breast, ovarian, tubal, or peritoneal cancers.  Pelvic exam and Pap test. This may be done every 3 years starting at age 37. Starting at age 70, this may be done every 5 years if you have a Pap test in combination with an HPV test.  Bone density scan. This is done to screen for osteoporosis. You may have this scan if you are at high risk for osteoporosis.  Discuss your test results, treatment options, and if necessary, the need for more tests with your health care  provider. Vaccines Your health care provider may recommend certain vaccines, such as:  Influenza vaccine. This is recommended every year.  Tetanus, diphtheria, and acellular pertussis (Tdap, Td) vaccine. You may need a Td booster every 10 years.  Varicella vaccine. You may need this if you have not been vaccinated.  Zoster vaccine. You may need this after age 57.  Measles, mumps, and rubella (MMR) vaccine. You may need at least one dose of MMR if you were born in 1957 or later. You may also need a second dose.  Pneumococcal 13-valent conjugate (PCV13) vaccine. You may need this if you have certain conditions and were not previously vaccinated.  Pneumococcal polysaccharide (PPSV23) vaccine. You may need one or two doses if you smoke cigarettes or if you have certain conditions.  Meningococcal vaccine. You may need this if you have certain conditions.  Hepatitis A vaccine. You may need this if you have certain conditions or if you travel or work in places where you may be exposed to hepatitis A.  Hepatitis B vaccine. You may need this if you have certain conditions or if you travel or work in places where you may be exposed to hepatitis B.  Haemophilus influenzae type b (Hib) vaccine. You may need this if you have certain conditions.  Talk to your health care provider about which screenings and vaccines you need and how often you need them. This information is not intended to replace advice given to you by your health care provider. Make sure you discuss any questions  you have with your health care provider. Document Released: 04/02/2015 Document Revised: 11/24/2015 Document Reviewed: 01/05/2015 Elsevier Interactive Patient Education  2017 [place] K. Panosh M.D.

## 2017-01-10 ENCOUNTER — Ambulatory Visit (INDEPENDENT_AMBULATORY_CARE_PROVIDER_SITE_OTHER): Payer: BLUE CROSS/BLUE SHIELD | Admitting: Internal Medicine

## 2017-01-10 ENCOUNTER — Encounter: Payer: Self-pay | Admitting: Internal Medicine

## 2017-01-10 VITALS — BP 146/80 | HR 121 | Temp 99.0°F | Ht 62.75 in | Wt 137.5 lb

## 2017-01-10 DIAGNOSIS — Z0000 Encounter for general adult medical examination without abnormal findings: Secondary | ICD-10-CM

## 2017-01-10 DIAGNOSIS — I10 Essential (primary) hypertension: Secondary | ICD-10-CM | POA: Diagnosis not present

## 2017-01-10 DIAGNOSIS — F411 Generalized anxiety disorder: Secondary | ICD-10-CM

## 2017-01-10 DIAGNOSIS — R51 Headache: Secondary | ICD-10-CM | POA: Diagnosis not present

## 2017-01-10 DIAGNOSIS — E785 Hyperlipidemia, unspecified: Secondary | ICD-10-CM | POA: Diagnosis not present

## 2017-01-10 DIAGNOSIS — R519 Headache, unspecified: Secondary | ICD-10-CM

## 2017-01-10 DIAGNOSIS — Z79899 Other long term (current) drug therapy: Secondary | ICD-10-CM

## 2017-01-10 LAB — BASIC METABOLIC PANEL
BUN: 9 mg/dL (ref 6–23)
CHLORIDE: 104 meq/L (ref 96–112)
CO2: 26 mEq/L (ref 19–32)
Calcium: 9.4 mg/dL (ref 8.4–10.5)
Creatinine, Ser: 0.66 mg/dL (ref 0.40–1.20)
GFR: 102.49 mL/min (ref >60.00)
Glucose, Bld: 88 mg/dL (ref 70–99)
POTASSIUM: 4.1 meq/L (ref 3.5–5.1)
Sodium: 138 mEq/L (ref 135–145)

## 2017-01-10 LAB — CBC WITH DIFFERENTIAL/PLATELET
Basophils Absolute: 0 10*3/uL (ref 0.0–0.1)
Basophils Relative: 0.6 % (ref 0.0–3.0)
EOS PCT: 0.9 % (ref 0.0–5.0)
Eosinophils Absolute: 0.1 10*3/uL (ref 0.0–0.7)
HEMATOCRIT: 40.1 % (ref 36.0–46.0)
HEMOGLOBIN: 13.4 g/dL (ref 12.0–15.0)
LYMPHS PCT: 28.7 % (ref 12.0–46.0)
Lymphs Abs: 1.7 10*3/uL (ref 0.7–4.0)
MCHC: 33.4 g/dL (ref 30.0–36.0)
MCV: 91.7 fl (ref 78.0–100.0)
MONO ABS: 0.4 10*3/uL (ref 0.1–1.0)
Monocytes Relative: 6.8 % (ref 3.0–12.0)
Neutro Abs: 3.6 10*3/uL (ref 1.4–7.7)
Neutrophils Relative %: 63 % (ref 43.0–77.0)
Platelets: 250 10*3/uL (ref 150.0–400.0)
RBC: 4.37 Mil/uL (ref 3.87–5.11)
RDW: 13.2 % (ref 11.5–15.5)
WBC: 5.7 10*3/uL (ref 4.0–10.5)

## 2017-01-10 LAB — LIPID PANEL
Cholesterol: 256 mg/dL — ABNORMAL HIGH (ref 0–200)
HDL: 55.9 mg/dL (ref >39.00)
LDL Cholesterol: 171 mg/dL — ABNORMAL HIGH (ref 0–99)
NONHDL: 200.34
Total CHOL/HDL Ratio: 5
Triglycerides: 145 mg/dL (ref 0.0–149.0)
VLDL: 29 mg/dL (ref 0.0–40.0)

## 2017-01-10 LAB — HEPATIC FUNCTION PANEL
ALBUMIN: 4 g/dL (ref 3.5–5.2)
ALT: 11 U/L (ref 0–35)
AST: 15 U/L (ref 0–37)
Alkaline Phosphatase: 30 U/L — ABNORMAL LOW (ref 39–117)
Bilirubin, Direct: 0.1 mg/dL (ref 0.0–0.3)
TOTAL PROTEIN: 6.6 g/dL (ref 6.0–8.3)
Total Bilirubin: 0.5 mg/dL (ref 0.2–1.2)

## 2017-01-10 LAB — TSH: TSH: 1.44 u[IU]/mL (ref 0.35–4.50)

## 2017-01-10 NOTE — Patient Instructions (Addendum)
Check blood pressure readings twice a day for 3-5 days and send in the readings on my chart.  At this point stay on same medication.  Attempt to lifestyle health eating and exercise.  We can decide next step depending on blood pressure control.     Preventive Care 40-64 Years, Female Preventive care refers to lifestyle choices and visits with your health care provider that can promote health and wellness. What does preventive care include?  A yearly physical exam. This is also called an annual well check.  Dental exams once or twice a year.  Routine eye exams. Ask your health care provider how often you should have your eyes checked.  Personal lifestyle choices, including: ? Daily care of your teeth and gums. ? Regular physical activity. ? Eating a healthy diet. ? Avoiding tobacco and drug use. ? Limiting alcohol use. ? Practicing safe sex. ? Taking low-dose aspirin daily starting at age 40. ? Taking vitamin and mineral supplements as recommended by your health care provider. What happens during an annual well check? The services and screenings done by your health care provider during your annual well check will depend on your age, overall health, lifestyle risk factors, and family history of disease. Counseling Your health care provider may ask you questions about your:  Alcohol use.  Tobacco use.  Drug use.  Emotional well-being.  Home and relationship well-being.  Sexual activity.  Eating habits.  Work and work Statistician.  Method of birth control.  Menstrual cycle.  Pregnancy history.  Screening You may have the following tests or measurements:  Height, weight, and BMI.  Blood pressure.  Lipid and cholesterol levels. These may be checked every 5 years, or more frequently if you are over 81 years old.  Skin check.  Lung cancer screening. You may have this screening every year starting at age 64 if you have a 30-pack-year history of smoking and  currently smoke or have quit within the past 15 years.  Fecal occult blood test (FOBT) of the stool. You may have this test every year starting at age 26.  Flexible sigmoidoscopy or colonoscopy. You may have a sigmoidoscopy every 5 years or a colonoscopy every 10 years starting at age 14.  Hepatitis C blood test.  Hepatitis B blood test.  Sexually transmitted disease (STD) testing.  Diabetes screening. This is done by checking your blood sugar (glucose) after you have not eaten for a while (fasting). You may have this done every 1-3 years.  Mammogram. This may be done every 1-2 years. Talk to your health care provider about when you should start having regular mammograms. This may depend on whether you have a family history of breast cancer.  BRCA-related cancer screening. This may be done if you have a family history of breast, ovarian, tubal, or peritoneal cancers.  Pelvic exam and Pap test. This may be done every 3 years starting at age 40. Starting at age 46, this may be done every 5 years if you have a Pap test in combination with an HPV test.  Bone density scan. This is done to screen for osteoporosis. You may have this scan if you are at high risk for osteoporosis.  Discuss your test results, treatment options, and if necessary, the need for more tests with your health care provider. Vaccines Your health care provider may recommend certain vaccines, such as:  Influenza vaccine. This is recommended every year.  Tetanus, diphtheria, and acellular pertussis (Tdap, Td) vaccine. You may need a  Td booster every 10 years.  Varicella vaccine. You may need this if you have not been vaccinated.  Zoster vaccine. You may need this after age 100.  Measles, mumps, and rubella (MMR) vaccine. You may need at least one dose of MMR if you were born in 1957 or later. You may also need a second dose.  Pneumococcal 13-valent conjugate (PCV13) vaccine. You may need this if you have certain  conditions and were not previously vaccinated.  Pneumococcal polysaccharide (PPSV23) vaccine. You may need one or two doses if you smoke cigarettes or if you have certain conditions.  Meningococcal vaccine. You may need this if you have certain conditions.  Hepatitis A vaccine. You may need this if you have certain conditions or if you travel or work in places where you may be exposed to hepatitis A.  Hepatitis B vaccine. You may need this if you have certain conditions or if you travel or work in places where you may be exposed to hepatitis B.  Haemophilus influenzae type b (Hib) vaccine. You may need this if you have certain conditions.  Talk to your health care provider about which screenings and vaccines you need and how often you need them. This information is not intended to replace advice given to you by your health care provider. Make sure you discuss any questions you have with your health care provider. Document Released: 04/02/2015 Document Revised: 11/24/2015 Document Reviewed: 01/05/2015 Elsevier Interactive Patient Education  2017 [employer].

## 2017-03-12 ENCOUNTER — Other Ambulatory Visit: Payer: Self-pay | Admitting: Internal Medicine

## 2017-05-28 ENCOUNTER — Telehealth: Payer: Self-pay | Admitting: Internal Medicine

## 2017-05-28 MED ORDER — LISINOPRIL 10 MG PO TABS: ORAL_TABLET | ORAL | 0 refills | Status: DC

## 2017-05-28 NOTE — Telephone Encounter (Signed)
Copied from CRM [id number]. Topic: Quick Communication - See Telephone Encounter >> May 28, 2017 12:42 PM Waymon AmatoBurton, Donna F wrote: Pt is needing a refill on lisinopril and would like to have her pharmacy on file be changed  to [facility] phone number [phone]  05/28/17.

## 2017-06-01 ENCOUNTER — Other Ambulatory Visit: Payer: Self-pay | Admitting: Internal Medicine

## 2017-06-01 MED ORDER — ESCITALOPRAM OXALATE 10 MG PO TABS: 10.0000 mg | ORAL_TABLET | Freq: Every day | ORAL | 0 refills | Status: DC

## 2017-06-01 NOTE — Telephone Encounter (Signed)
Copied from CRM [id number]. Topic: Quick Communication - Rx Refill/Question >> Jun 01, 2017  4:38 PM Landry MellowFoltz, Melissa J wrote: Medication: escitalopram (LEXAPRO) 10 MG tablet   Has the patient contacted their pharmacy? Yes.  Was told to call    (Agent: If no, request that the patient contact the pharmacy for the refill.)   Preferred Pharmacy (with phone number or street name): [facility]   Agent: Please be advised that RX refills may take up to 3 business days. We ask that you follow-up with your pharmacy.

## 2017-06-01 NOTE — Telephone Encounter (Signed)
Refill sent to [facility] as requested.

## 2017-07-25 ENCOUNTER — Other Ambulatory Visit: Payer: Self-pay | Admitting: Internal Medicine

## 2017-07-29 ENCOUNTER — Other Ambulatory Visit: Payer: Self-pay | Admitting: Internal Medicine

## 2017-09-06 DIAGNOSIS — N959 Unspecified menopausal and perimenopausal disorder: Secondary | ICD-10-CM

## 2017-09-06 HISTORY — DX: Unspecified menopausal and perimenopausal disorder: N95.9

## 2017-09-24 ENCOUNTER — Encounter: Payer: Self-pay | Admitting: Internal Medicine

## 2017-11-01 ENCOUNTER — Other Ambulatory Visit: Payer: Self-pay | Admitting: Internal Medicine

## 2018-01-09 NOTE — Progress Notes (Signed)
Chief Complaint  Patient presents with  . Annual Exam    Fasting today. No new concerns    HPI: Patient  Anna Poole  47 y.o. comes in today for Preventive Health Care visit  And med checl    HAs  Off ocps   X 3 mos   HAs       No maxalt  rizotriptan  Helps  Sees neuro    Better  As of now.  Sees dr Sima Matas   Bp has been ok  On meds   No contraception on menses now   Lorazepam "not used in forever." With flies  But if sever anxiety   Aunt had breast cancer   lexapro seems to help a good bit .   Health Maintenance  Topic Date Due  . HIV Screening  01/20/1986  . TETANUS/TDAP  07/11/2017  . INFLUENZA VACCINE  06/19/2018 (Originally 10/18/2017)  . PAP SMEAR  04/04/2018  . MAMMOGRAM  09/18/2018   Health Maintenance Review LIFESTYLE:  Exercise:   Not as good    Since back issues  walking Tobacco/ETS: no Alcohol:  Little  Sugar beverages: no Sleep:  6 hours    +  Drug use: no HH of 4 +  One dog  Work: 38     ROS: som positional dizziness when lasy down and turns head but not with daily acitivity had  Back strain  And better  But hanst gotten back int oreg exersie x dog walking  GEN/ HEENT: No fever, significant weight changes sweats headaches vision problems hearing changes, CV/ PULM; No chest pain shortness of breath cough, syncope,edema  change in exercise tolerance. GI /GU: No adominal pain, vomiting, change in bowel habits. No blood in the stool. No significant GU symptoms. SKIN/HEME: ,no acute skin rashes suspicious lesions or bleeding. No lymphadenopathy, nodules, masses.  NEURO/ PSYCH:  No neurologic signs such as weakness numbness. No depression anxiety. IMM/ Allergy: No unusual infections.  Allergy .   REST of 12 system review negative except as per HPI   Past Medical History:  Diagnosis Date  . Anxiety   . CARDIAC MURMUR 07/12/2007   Qualifier: Diagnosis of  By: Hulan Saas, CMA (AAMA), Quita Skye   . Headache(784.0)   . Heart murmur    had neg echo and ekg    . History of miscarriage 2009  . Hx of syncope    summer poss heat and hydration  . Hyperkalemia 07/08/2010  . Hypertension    Hosp 11/09 and K 3.2 neg eval, nl echo and neg aldopra ratio  . HYPOKALEMIA, HX OF 02/18/2008   Qualifier: Diagnosis of  By: Regis Bill MD, Standley Brooking   . IRREGULAR MENSES 09/06/2007   Qualifier: Diagnosis of  By: Hulan Saas, CMA (AAMA), Quita Skye   . Palpitations   . Panic attack 06/13/2011  . Preeclampsia    ht in pregnancy   . Premenopausal patient 09/06/2017   see report    Past Surgical History:  Procedure Laterality Date  . OVARIAN CYST REMOVAL  1991   richardson    Family History  Problem Relation Age of Onset  . Hypertension Mother   . Diabetes type II Unknown        ggf  . Stroke Unknown        pgf     Social History   Socioeconomic History  . Marital status: Married    Spouse name: Not on file  . Number of children: Not on file  .  Years of education: Not on file  . Highest education level: Not on file  Occupational History  . Not on file  Social Needs  . Financial resource strain: Not on file  . Food insecurity:    Worry: Not on file    Inability: Not on file  . Transportation needs:    Medical: Not on file    Non-medical: Not on file  Tobacco Use  . Smoking status: Never Smoker  . Smokeless tobacco: Never Used  Substance and Sexual Activity  . Alcohol use: Yes  . Drug use: No  . Sexual activity: Not on file  Lifestyle  . Physical activity:    Days per week: Not on file    Minutes per session: Not on file  . Stress: Not on file  Relationships  . Social connections:    Talks on phone: Not on file    Gets together: Not on file    Attends religious service: Not on file    Active member of club or organization: Not on file    Attends meetings of clubs or organizations: Not on file    Relationship status: Not on file  Other Topics Concern  . 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 escitalopram (LEXAPRO) 10 MG tablet TAKE 1 TABLET BY MOUTH  DAILY 90 tablet 0  . GENERESS FE 0.8-25 MG-MCG tablet Chew 1 tablet by mouth daily.     Marland Kitchen lisinopril (PRINIVIL,ZESTRIL) 10 MG tablet TAKE 1/2 TO 1 TABLET BY  MOUTH DAILY 90 tablet 0  . LORazepam (ATIVAN) 0.5 MG tablet TAKE 1 OR 2 TABLETS BY MOUTH EVERY 8 HOURS AS NEEDED FOR ANXIETY 20 tablet 0  . rizatriptan (MAXALT-MLT) 10 MG disintegrating tablet DISSOLVE ONE TABLET BY MOUTH AS NEEDED FOR MIGRAINE. REPEAT IN 2 HOURS IF NEEDED. 10 tablet 1   No facility-administered medications prior to visit.      EXAM:  BP 122/78 (BP Location: Right Arm, Patient Position: Sitting, Cuff Size: Normal)   Pulse (!) 108   Temp 98.4 F (36.9 C) (Oral)   Ht 5' 2.75" (1.594 m)   Wt 129 lb 6.4 oz (58.7 kg)   BMI 23.11 kg/m   Body mass index is 23.11 kg/m. Wt Readings from Last 3 Encounters:  01/11/18 129 lb 6.4 oz (58.7 kg)  01/10/17 137 lb 8 oz (62.4 kg)  10/31/16 132 lb 6.4 oz (60.1 kg)    Physical Exam: Vital signs reviewed ZOX:WRUE is a well-developed well-nourished alert cooperative    who appearsr stated age in no acute distress.  HEENT: normocephalic atraumatic , Eyes: PERRL EOM's full, conjunctiva clear, Nares: paten,t no deformity discharge or tenderness., Ears: no deformity EAC's clear TMs with normal landmarks. Mouth: clear OP, no lesions, edema.  Moist mucous membranes. Dentition in adequate repair. NECK: supple without masses, thyromegaly or bruits. CHEST/PULM:  Clear to auscultation and percussion breath sounds equal no wheeze , rales or rhonchi. No chest wall deformities or tenderness. Breast: normal by inspection . No dimpling, discharge, masses, tenderness or discharge . CV: PMI is nondisplaced, S1 S2 no gallops, murmurs, rubs.  Peripheral pulses are  full without delay.No JVD .  ABDOMEN: Bowel sounds normal nontender  No guard or rebound, no hepato splenomegal no CVA tenderness.  No hernia. Extremtities:  No clubbing cyanosis or edema, no acute joint swelling or redness no focal atrophy NEURO:  Oriented x3, cranial nerves 3-12 appear to be intact, no obvious focal weakness,gait within normal limits no abnormal reflexes or asymmetrical SKIN: No acute rashes normal turgor, color, no bruising or petechiae. PSYCH: Oriented, good eye contact, no obvious depression anxiety, cognition and judgment appear normal. LN: no cervical axillary inguinal adenopathy  Lab Results  Component Value Date   WBC 5.7 01/10/2017   HGB 13.4 01/10/2017   HCT 40.1 01/10/2017   PLT 250.0 01/10/2017   GLUCOSE 88 01/10/2017   CHOL 256 (H) 01/10/2017   TRIG 145.0 01/10/2017   HDL 55.90 01/10/2017   LDLDIRECT 166.2 03/07/2013   LDLCALC 171 (H) 01/10/2017   ALT 11 01/10/2017   AST 15 01/10/2017   NA 138 01/10/2017   K 4.1 01/10/2017   CL 104 01/10/2017   CREATININE 0.66 01/10/2017   BUN 9 01/10/2017   CO2 26 01/10/2017   TSH 1.44 01/10/2017   INR 1.1 02/12/2008   HGBA1C  02/12/2008    5.2 (NOTE)   The ADA recommends the following therapeutic goal for glycemic   control related to Hgb A1C measurement:   Goal of Therapy:   < 7.0% Hgb A1C   Reference: American Diabetes Association: Clinical Practice   Recommendations 2008, Diabetes Care,  2008, 31:(Suppl 1).    BP Readings from Last 3 Encounters:  01/11/18 122/78  01/10/17 (!) 146/80  11/16/16 (!) 155/92      ASSESSMENT AND PLAN:  Discussed the following assessment and plan:  Visit for preventive health examination - Plan: Basic metabolic panel, CBC with Differential/Platelet, Hepatic function panel, Lipid panel, TSH  Medication management - Plan: Basic metabolic panel, CBC with Differential/Platelet, Hepatic function panel, Lipid panel, TSH  Essential hypertension - Plan:  Basic metabolic panel, CBC with Differential/Platelet, Hepatic function panel, Lipid panel, TSH  Influenza vaccination declined by patient  Anxiety state - stable  medication  to continue rare [place] of ativan travel etc Declined flu vaccine  Patient Care Team: Burnis Medin, MD as PCP - General Paula Compton, MD (Obstetrics and Gynecology) Amil Amen, MD as Referring Physician (Psychiatry) Patient Instructions  Continue lifestyle intervention healthy eating and exercise . consider flu vaccine  .     Preventive Care 40-64 Years, Female Preventive care refers to lifestyle choices and visits with your health care provider that can promote health and wellness. What does preventive care include?  A yearly physical exam. This is also called an annual well check.  Dental exams once or twice a year.  Routine eye exams. Ask your health care provider how often you should have your eyes checked.  Personal lifestyle choices, including: ? Daily care of your teeth and gums. ? Regular physical activity. ? Eating a healthy diet. ? Avoiding tobacco and drug use. ? Limiting alcohol use. ? Practicing safe sex. ? Taking low-dose aspirin daily starting at age 74. ? Taking vitamin and mineral supplements as recommended by your health care provider. What happens during an annual well check? The services and screenings done by your health care provider during your annual well check will depend on your age, overall health, lifestyle risk factors, and family history of disease. Counseling Your health care provider may ask you questions about your:  Alcohol use.  Tobacco  use.  Drug use.  Emotional well-being.  Home and relationship well-being.  Sexual activity.  Eating habits.  Work and work Statistician.  Method of birth control.  Menstrual cycle.  Pregnancy history.  Screening You may have the following tests or measurements:  Height, weight, and BMI.  Blood  pressure.  Lipid and cholesterol levels. These may be checked every 5 years, or more frequently if you are over 68 years old.  Skin check.  Lung cancer screening. You may have this screening every year starting at age 34 if you have a 30-pack-year history of smoking and currently smoke or have quit within the past 15 years.  Fecal occult blood test (FOBT) of the stool. You may have this test every year starting at age 86.  Flexible sigmoidoscopy or colonoscopy. You may have a sigmoidoscopy every 5 years or a colonoscopy every 10 years starting at age 65.  Hepatitis C blood test.  Hepatitis B blood test.  Sexually transmitted disease (STD) testing.  Diabetes screening. This is done by checking your blood sugar (glucose) after you have not eaten for a while (fasting). You may have this done every 1-3 years.  Mammogram. This may be done every 1-2 years. Talk to your health care provider about when you should start having regular mammograms. This may depend on whether you have a family history of breast cancer.  BRCA-related cancer screening. This may be done if you have a family history of breast, ovarian, tubal, or peritoneal cancers.  Pelvic exam and Pap test. This may be done every 3 years starting at age 45. Starting at age 64, this may be done every 5 years if you have a Pap test in combination with an HPV test.  Bone density scan. This is done to screen for osteoporosis. You may have this scan if you are at high risk for osteoporosis.  Discuss your test results, treatment options, and if necessary, the need for more tests with your health care provider. Vaccines Your health care provider may recommend certain vaccines, such as:  Influenza vaccine. This is recommended every year.  Tetanus, diphtheria, and acellular pertussis (Tdap, Td) vaccine. You may need a Td booster every 10 years.  Varicella vaccine. You may need this if you have not been vaccinated.  Zoster vaccine. You  may need this after age 40.  Measles, mumps, and rubella (MMR) vaccine. You may need at least one dose of MMR if you were born in 1957 or later. You may also need a second dose.  Pneumococcal 13-valent conjugate (PCV13) vaccine. You may need this if you have certain conditions and were not previously vaccinated.  Pneumococcal polysaccharide (PPSV23) vaccine. You may need one or two doses if you smoke cigarettes or if you have certain conditions.  Meningococcal vaccine. You may need this if you have certain conditions.  Hepatitis A vaccine. You may need this if you have certain conditions or if you travel or work in places where you may be exposed to hepatitis A.  Hepatitis B vaccine. You may need this if you have certain conditions or if you travel or work in places where you may be exposed to hepatitis B.  Haemophilus influenzae type b (Hib) vaccine. You may need this if you have certain conditions.  Talk to your health care provider about which screenings and vaccines you need and how often you need them. This information is not intended to replace advice given to you by your health care provider. Make sure you  discuss any questions you have with your health care provider. Document Released: 04/02/2015 Document Revised: 11/24/2015 Document Reviewed: 01/05/2015 Elsevier Interactive Patient Education  2018 [place]. Panosh M.D.

## 2018-01-11 ENCOUNTER — Ambulatory Visit (INDEPENDENT_AMBULATORY_CARE_PROVIDER_SITE_OTHER): Payer: BLUE CROSS/BLUE SHIELD | Admitting: Internal Medicine

## 2018-01-11 ENCOUNTER — Encounter: Payer: Self-pay | Admitting: Internal Medicine

## 2018-01-11 VITALS — BP 122/78 | HR 108 | Temp 98.4°F | Ht 62.75 in | Wt 129.4 lb

## 2018-01-11 DIAGNOSIS — F411 Generalized anxiety disorder: Secondary | ICD-10-CM

## 2018-01-11 DIAGNOSIS — Z0000 Encounter for general adult medical examination without abnormal findings: Secondary | ICD-10-CM

## 2018-01-11 DIAGNOSIS — Z2821 Immunization not carried out because of patient refusal: Secondary | ICD-10-CM

## 2018-01-11 DIAGNOSIS — Z79899 Other long term (current) drug therapy: Secondary | ICD-10-CM

## 2018-01-11 DIAGNOSIS — I10 Essential (primary) hypertension: Secondary | ICD-10-CM | POA: Diagnosis not present

## 2018-01-11 LAB — BASIC METABOLIC PANEL
BUN: 14 mg/dL (ref 6–23)
CO2: 27 mEq/L (ref 19–32)
Calcium: 9.5 mg/dL (ref 8.4–10.5)
Chloride: 105 mEq/L (ref 96–112)
Creatinine, Ser: 0.72 mg/dL (ref 0.40–1.20)
GFR: 92.29 mL/min (ref >60.00)
Glucose, Bld: 86 mg/dL (ref 70–99)
Potassium: 4.3 mEq/L (ref 3.5–5.1)
Sodium: 140 mEq/L (ref 135–145)

## 2018-01-11 LAB — CBC WITH DIFFERENTIAL/PLATELET
BASOS ABS: 0 10*3/uL (ref 0.0–0.1)
BASOS PCT: 0.9 % (ref 0.0–3.0)
EOS ABS: 0.1 10*3/uL (ref 0.0–0.7)
Eosinophils Relative: 2.3 % (ref 0.0–5.0)
HEMATOCRIT: 38.2 % (ref 36.0–46.0)
HEMOGLOBIN: 13.2 g/dL (ref 12.0–15.0)
LYMPHS PCT: 36.2 % (ref 12.0–46.0)
Lymphs Abs: 1.6 10*3/uL (ref 0.7–4.0)
MCHC: 34.6 g/dL (ref 30.0–36.0)
MCV: 88.2 fl (ref 78.0–100.0)
MONOS PCT: 11.1 % (ref 3.0–12.0)
Monocytes Absolute: 0.5 10*3/uL (ref 0.1–1.0)
NEUTROS ABS: 2.1 10*3/uL (ref 1.4–7.7)
Neutrophils Relative %: 49.5 % (ref 43.0–77.0)
PLATELETS: 221 10*3/uL (ref 150.0–400.0)
RBC: 4.33 Mil/uL (ref 3.87–5.11)
RDW: 12.4 % (ref 11.5–15.5)
WBC: 4.3 10*3/uL (ref 4.0–10.5)

## 2018-01-11 LAB — HEPATIC FUNCTION PANEL
ALT: 23 U/L (ref 0–35)
AST: 18 U/L (ref 0–37)
Albumin: 4.4 g/dL (ref 3.5–5.2)
Alkaline Phosphatase: 54 U/L (ref 39–117)
BILIRUBIN TOTAL: 0.5 mg/dL (ref 0.2–1.2)
Bilirubin, Direct: 0.1 mg/dL (ref 0.0–0.3)
Total Protein: 6.8 g/dL (ref 6.0–8.3)

## 2018-01-11 LAB — LIPID PANEL
CHOLESTEROL: 195 mg/dL (ref 0–200)
HDL: 65.2 mg/dL (ref >39.00)
LDL CALC: 119 mg/dL — AB (ref 0–99)
NonHDL: 129.73
TRIGLYCERIDES: 55 mg/dL (ref 0.0–149.0)
Total CHOL/HDL Ratio: 3
VLDL: 11 mg/dL (ref 0.0–40.0)

## 2018-01-11 LAB — TSH: TSH: 1.32 u[IU]/mL (ref 0.35–4.50)

## 2018-01-11 MED ORDER — LORAZEPAM 0.5 MG PO TABS: ORAL_TABLET | ORAL | 0 refills | Status: DC

## 2018-01-11 MED ORDER — LISINOPRIL 10 MG PO TABS: 5.0000 mg | ORAL_TABLET | Freq: Every day | ORAL | 3 refills | Status: DC

## 2018-01-11 MED ORDER — ESCITALOPRAM OXALATE 10 MG PO TABS: 10.0000 mg | ORAL_TABLET | Freq: Every day | ORAL | 3 refills | Status: DC

## 2018-01-11 NOTE — Patient Instructions (Addendum)
Continue lifestyle intervention healthy eating and exercise . consider flu vaccine  .     Preventive Care 40-64 Years, Female Preventive care refers to lifestyle choices and visits with your health care provider that can promote health and wellness. What does preventive care include?  A yearly physical exam. This is also called an annual well check.  Dental exams once or twice a year.  Routine eye exams. Ask your health care provider how often you should have your eyes checked.  Personal lifestyle choices, including: ? Daily care of your teeth and gums. ? Regular physical activity. ? Eating a healthy diet. ? Avoiding tobacco and drug use. ? Limiting alcohol use. ? Practicing safe sex. ? Taking low-dose aspirin daily starting at age 82. ? Taking vitamin and mineral supplements as recommended by your health care provider. What happens during an annual well check? The services and screenings done by your health care provider during your annual well check will depend on your age, overall health, lifestyle risk factors, and family history of disease. Counseling Your health care provider may ask you questions about your:  Alcohol use.  Tobacco use.  Drug use.  Emotional well-being.  Home and relationship well-being.  Sexual activity.  Eating habits.  Work and work Statistician.  Method of birth control.  Menstrual cycle.  Pregnancy history.  Screening You may have the following tests or measurements:  Height, weight, and BMI.  Blood pressure.  Lipid and cholesterol levels. These may be checked every 5 years, or more frequently if you are over 36 years old.  Skin check.  Lung cancer screening. You may have this screening every year starting at age 49 if you have a 30-pack-year history of smoking and currently smoke or have quit within the past 15 years.  Fecal occult blood test (FOBT) of the stool. You may have this test every year starting at age  21.  Flexible sigmoidoscopy or colonoscopy. You may have a sigmoidoscopy every 5 years or a colonoscopy every 10 years starting at age 69.  Hepatitis C blood test.  Hepatitis B blood test.  Sexually transmitted disease (STD) testing.  Diabetes screening. This is done by checking your blood sugar (glucose) after you have not eaten for a while (fasting). You may have this done every 1-3 years.  Mammogram. This may be done every 1-2 years. Talk to your health care provider about when you should start having regular mammograms. This may depend on whether you have a family history of breast cancer.  BRCA-related cancer screening. This may be done if you have a family history of breast, ovarian, tubal, or peritoneal cancers.  Pelvic exam and Pap test. This may be done every 3 years starting at age 19. Starting at age 67, this may be done every 5 years if you have a Pap test in combination with an HPV test.  Bone density scan. This is done to screen for osteoporosis. You may have this scan if you are at high risk for osteoporosis.  Discuss your test results, treatment options, and if necessary, the need for more tests with your health care provider. Vaccines Your health care provider may recommend certain vaccines, such as:  Influenza vaccine. This is recommended every year.  Tetanus, diphtheria, and acellular pertussis (Tdap, Td) vaccine. You may need a Td booster every 10 years.  Varicella vaccine. You may need this if you have not been vaccinated.  Zoster vaccine. You may need this after age 32.  Measles, mumps, and  rubella (MMR) vaccine. You may need at least one dose of MMR if you were born in 1957 or later. You may also need a second dose.  Pneumococcal 13-valent conjugate (PCV13) vaccine. You may need this if you have certain conditions and were not previously vaccinated.  Pneumococcal polysaccharide (PPSV23) vaccine. You may need one or two doses if you smoke cigarettes or if you  have certain conditions.  Meningococcal vaccine. You may need this if you have certain conditions.  Hepatitis A vaccine. You may need this if you have certain conditions or if you travel or work in places where you may be exposed to hepatitis A.  Hepatitis B vaccine. You may need this if you have certain conditions or if you travel or work in places where you may be exposed to hepatitis B.  Haemophilus influenzae type b (Hib) vaccine. You may need this if you have certain conditions.  Talk to your health care provider about which screenings and vaccines you need and how often you need them. This information is not intended to replace advice given to you by your health care provider. Make sure you discuss any questions you have with your health care provider. Document Released: 04/02/2015 Document Revised: 11/24/2015 Document Reviewed: 01/05/2015 Elsevier Interactive Patient Education  [employer].

## 2018-01-31 ENCOUNTER — Other Ambulatory Visit: Payer: Self-pay | Admitting: Internal Medicine

## 2018-02-20 IMAGING — MR MR LUMBAR SPINE W/O CM
6 series · 48 of 48 positions shown · non-contrast
Comparison: None.

CLINICAL DATA: Low back pain radiating to RIGHT lower extremity
with numbness and weakness.

EXAM:
MRI LUMBAR SPINE WITHOUT CONTRAST
TECHNIQUE: Multiplanar, multisequence MR imaging of the lumbar spine was
performed. No intravenous contrast was administered.

[Series 3: T2 · sagittal · 4.0mm · 0.88mm/px · 4 of 12 slices shown (1 of 3)]
[im 1/12]
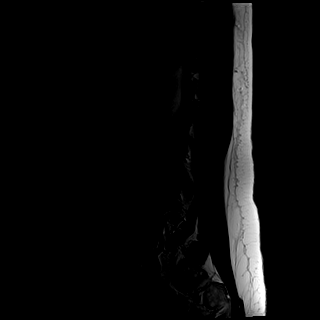
[im 4/12]
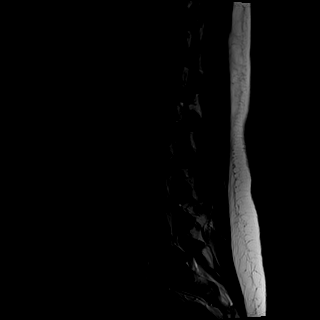
[im 8/12]
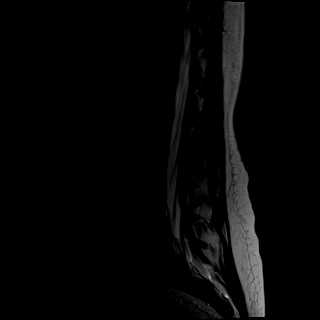
[im 12/12]
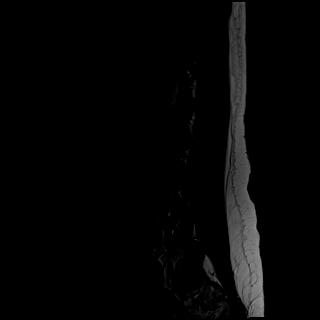

[Series 4: T1 · sagittal · 4.0mm · 0.88mm/px · 4 of 12 slices shown (1 of 2)]
[im 1/12]
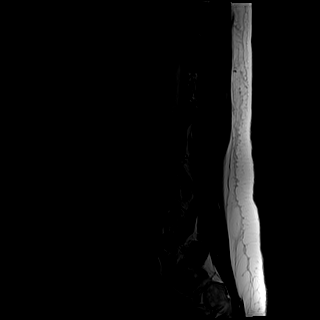
[im 4/12]
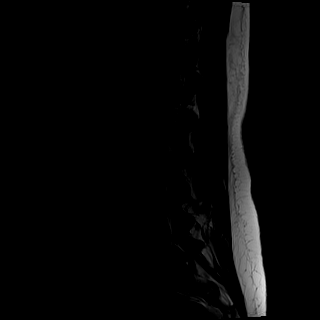
[im 8/12]
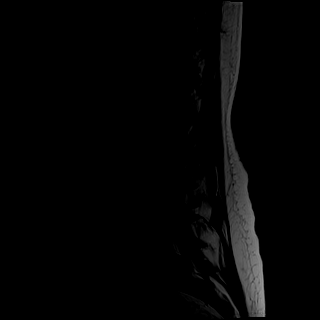
[im 12/12]
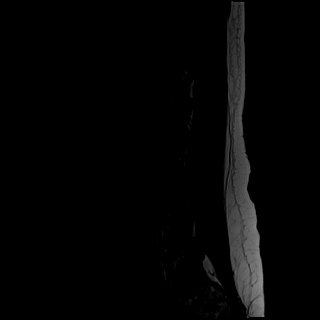

[Series 5: tirm sag · sagittal · 4.0mm · 0.55mm/px · 4 of 12 slices shown]
[im 1/12]
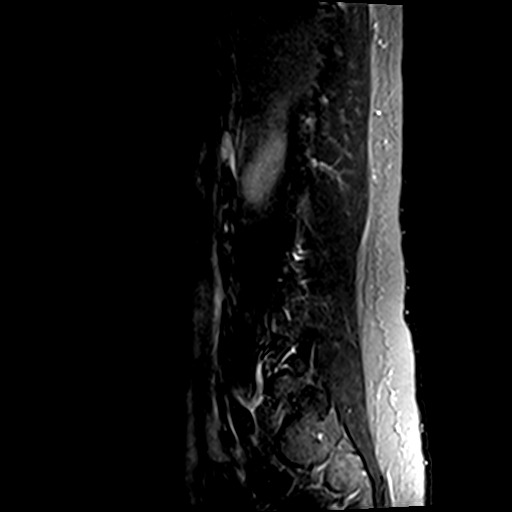
[im 4/12]
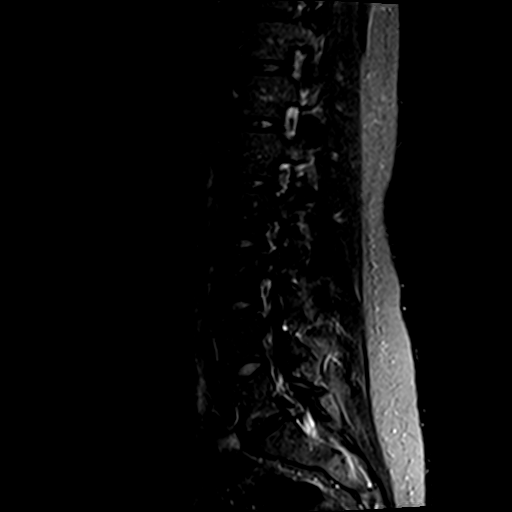
[im 8/12]
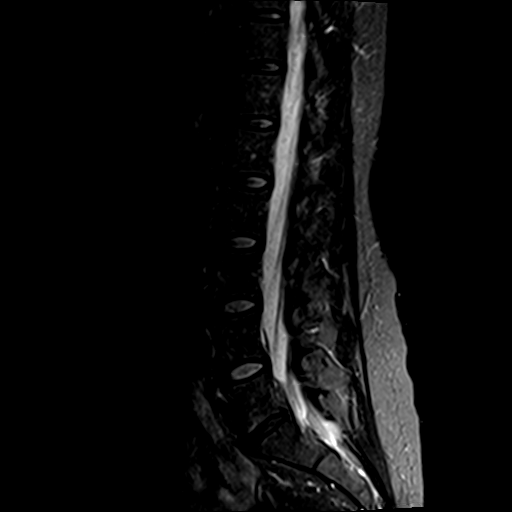
[im 12/12]
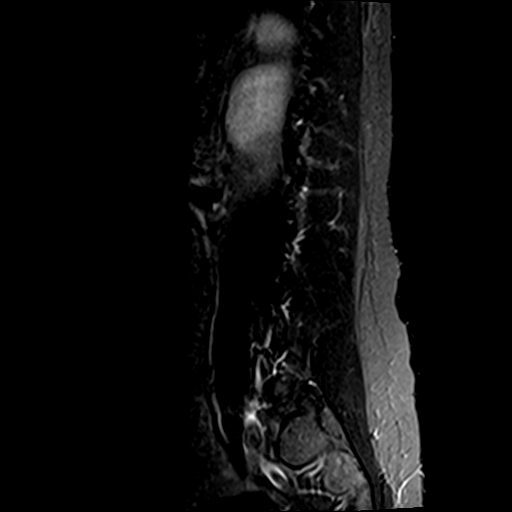

[Series 6: T1 · axial · 4.0mm · 0.78mm/px · z∈[-112,+102]mm · 12 of 37 slices shown (2 of 2)]
[im 1/37]
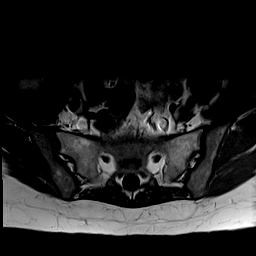
[im 4/37]
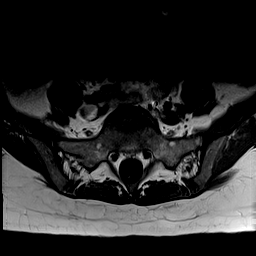
[im 7/37]
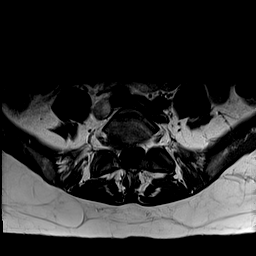
[im 10/37]
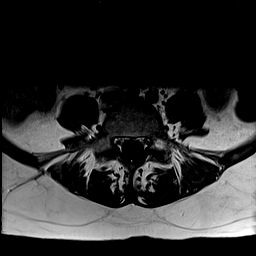
[im 14/37]
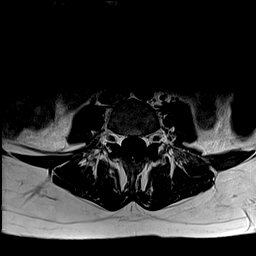
[im 17/37]
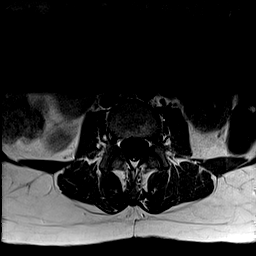
[im 20/37]
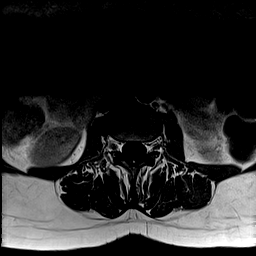
[im 23/37]
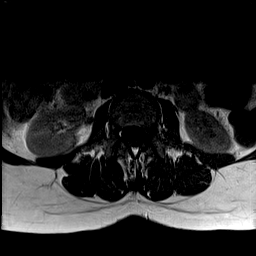
[im 27/37]
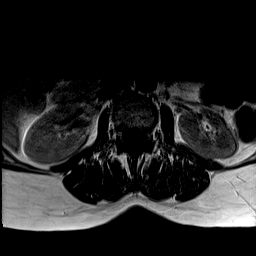
[im 30/37]
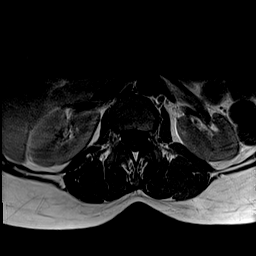
[im 33/37]
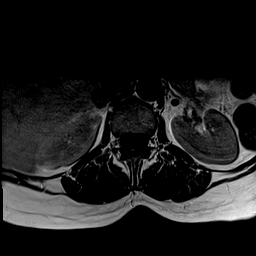
[im 37/37]
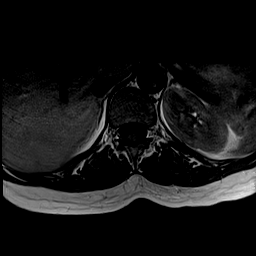

[Series 7: T2 · axial · 4.0mm · 0.78mm/px · z∈[-112,+102]mm · 12 of 37 slices shown (2 of 3)]
[im 1/37]
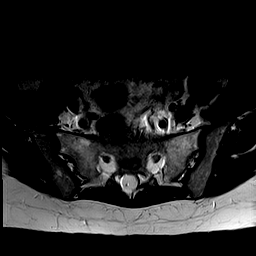
[im 4/37]
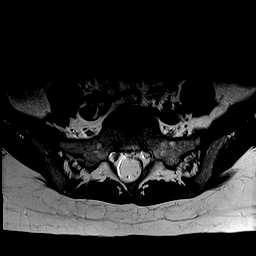
[im 7/37]
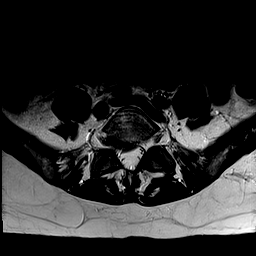
[im 10/37]
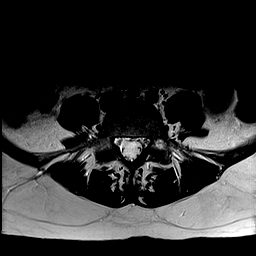
[im 14/37]
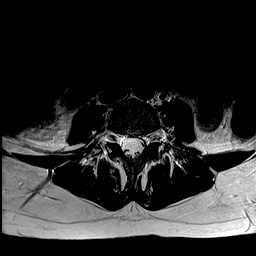
[im 17/37]
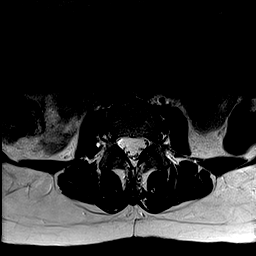
[im 20/37]
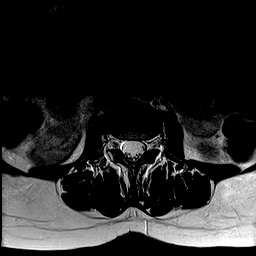
[im 23/37]
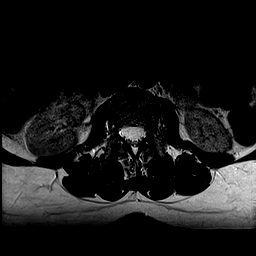
[im 27/37]
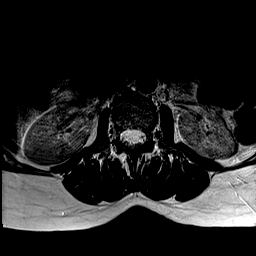
[im 30/37]
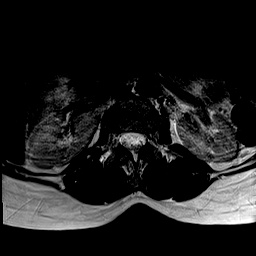
[im 33/37]
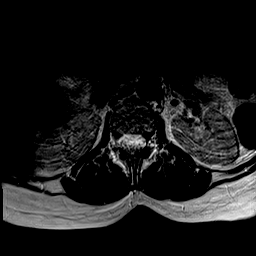
[im 37/37]
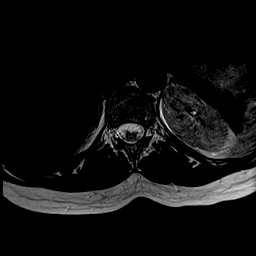

[Series 8: T2 · axial · 4.0mm · 0.78mm/px · z∈[-112,+102]mm · 12 of 37 slices shown (3 of 3)]
[im 1/37]
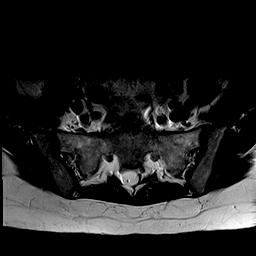
[im 4/37]
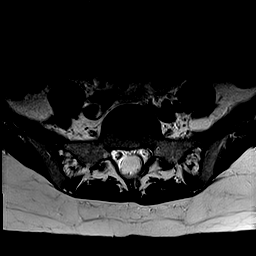
[im 7/37]
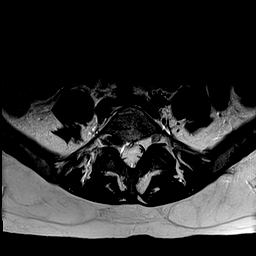
[im 10/37]
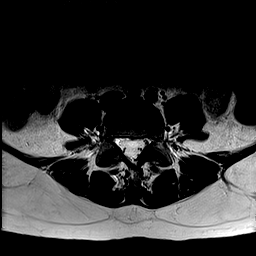
[im 14/37]
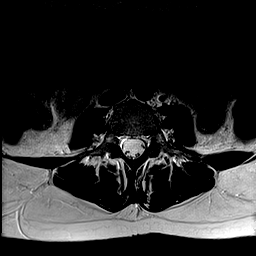
[im 17/37]
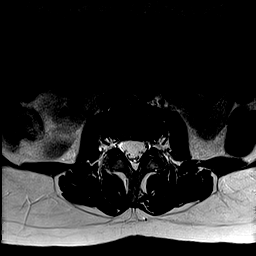
[im 20/37]
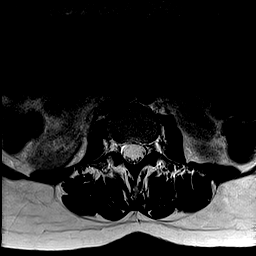
[im 23/37]
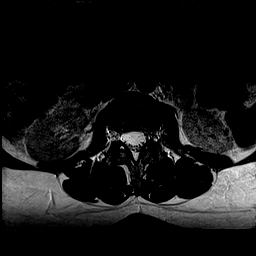
[im 27/37]
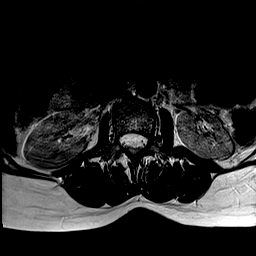
[im 30/37]
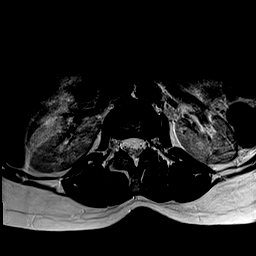
[im 33/37]
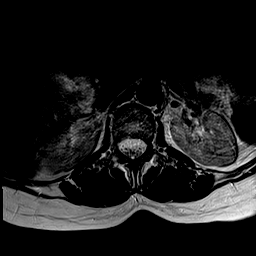
[im 37/37]
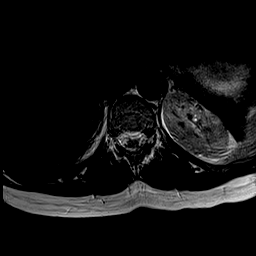

[48 of 48 positions shown; findings below may reference images not displayed]

FINDINGS: SEGMENTATION: For the purposes of this report, the last well-formed
intervertebral disc will be reported as L5-S1.

ALIGNMENT: Maintained lumbar lordosis. No malalignment.

VERTEBRAE:Vertebral bodies are intact. Mild L5-S1 disc height loss
with disc desiccation. Acute L5 inferior endplate Schmorl's node.

CONUS MEDULLARIS: Conus medullaris terminates at T12-L1 and
demonstrates normal morphology and signal characteristics. Linear T1
signal along the filum terminale with chemical shift artifact
consistent with fibro lipomatosis changes without cord tethering.

PARASPINAL AND SOFT TISSUES: Included prevertebral and paraspinal
soft tissues are normal.

DISC LEVELS:

L1-2 thru L4-5: No disc bulge, canal stenosis nor neural foraminal
narrowing.

L5-S1: 7 x 7 mm disc extrusion with 10 mm contiguous superior
migration with indistinct margins compatible with blood
products/granulation tissue contacting the exited RIGHT L5 nerve.
RIGHT lateral recess effacement, posterior displacement of the
traversing RIGHT S1 nerve. No canal stenosis. Mild to moderate RIGHT
neural foraminal narrowing.
IMPRESSION: 1. Large L5-S1 disc protrusion and superimposed disc extrusion
resulting in RIGHT L5 and S1 nerve impingement.
2. No canal stenosis.  No fracture or malalignment.

## 2018-03-14 ENCOUNTER — Other Ambulatory Visit: Payer: Self-pay | Admitting: Internal Medicine

## 2018-03-19 NOTE — Telephone Encounter (Signed)
Patient is calling and states these two medications were supposed to be sent to her mail order pharmacy in October [facility]. Patient states [facility] and would like for the refills to be sent to Mail order pharmacy. Patient is not going to pick up Lisinopril and would like for that to be sent to mail order.   Please advise.   [facility] [place] - [phone] [facility] [address]  [address] [place] [place] [id number]  Phone: [phone] Fax: [phone]

## 2019-01-31 ENCOUNTER — Telehealth: Payer: Self-pay | Admitting: Internal Medicine

## 2019-02-03 NOTE — Telephone Encounter (Signed)
She is over due for yearly visit  Arrange  cpx or yearly visit and t hen can refill x1 so she doesn't run out  If she wasn't lab pre visit let me know and I will place orders

## 2019-02-03 NOTE — Telephone Encounter (Signed)
LVM for patient to call back to set up appt.

## 2019-02-06 NOTE — Telephone Encounter (Signed)
lvm for pt to call back to arrange appt

## 2019-02-12 MED ORDER — LISINOPRIL 10 MG PO TABS: 5.0000 mg | ORAL_TABLET | Freq: Every day | ORAL | 0 refills | Status: DC

## 2019-02-12 MED ORDER — ESCITALOPRAM OXALATE 10 MG PO TABS: 10.0000 mg | ORAL_TABLET | Freq: Every day | ORAL | 0 refills | Status: DC

## 2019-02-12 NOTE — Telephone Encounter (Signed)
Refills sent in to get her by till appt

## 2019-02-12 NOTE — Telephone Encounter (Signed)
Pt called in and scheduled CPE, pt isn't able to have CPE until February. Pt would like to know if she is able to have a refill until then OR do she need to be seen sooner?   Please assist pt.

## 2019-02-12 NOTE — Addendum Note (Signed)
Addended by: Modena Morrow R on: 02/12/2019 12:27 PM   Modules accepted: Orders

## 2019-02-12 NOTE — Telephone Encounter (Signed)
Left detailed message letting patient know that we sent this in to get her to her appt

## 2019-04-22 ENCOUNTER — Other Ambulatory Visit: Payer: Self-pay | Admitting: Internal Medicine

## 2019-04-25 ENCOUNTER — Ambulatory Visit (INDEPENDENT_AMBULATORY_CARE_PROVIDER_SITE_OTHER): Payer: BC Managed Care – PPO | Admitting: Internal Medicine

## 2019-04-25 ENCOUNTER — Other Ambulatory Visit: Payer: Self-pay

## 2019-04-25 ENCOUNTER — Encounter: Payer: Self-pay | Admitting: Internal Medicine

## 2019-04-25 VITALS — BP 124/72 | HR 79 | Temp 98.4°F | Ht 63.39 in | Wt 126.2 lb

## 2019-04-25 DIAGNOSIS — Z1211 Encounter for screening for malignant neoplasm of colon: Secondary | ICD-10-CM

## 2019-04-25 DIAGNOSIS — Z23 Encounter for immunization: Secondary | ICD-10-CM

## 2019-04-25 DIAGNOSIS — Z0000 Encounter for general adult medical examination without abnormal findings: Secondary | ICD-10-CM

## 2019-04-25 DIAGNOSIS — I10 Essential (primary) hypertension: Secondary | ICD-10-CM | POA: Diagnosis not present

## 2019-04-25 DIAGNOSIS — Z79899 Other long term (current) drug therapy: Secondary | ICD-10-CM

## 2019-04-25 LAB — HEPATIC FUNCTION PANEL
ALT: 14 U/L (ref 0–35)
AST: 17 U/L (ref 0–37)
Albumin: 4.2 g/dL (ref 3.5–5.2)
Alkaline Phosphatase: 44 U/L (ref 39–117)
Bilirubin, Direct: 0.1 mg/dL (ref 0.0–0.3)
Total Bilirubin: 0.4 mg/dL (ref 0.2–1.2)
Total Protein: 6.7 g/dL (ref 6.0–8.3)

## 2019-04-25 LAB — CBC WITH DIFFERENTIAL/PLATELET
Basophils Absolute: 0 10*3/uL (ref 0.0–0.1)
Basophils Relative: 0.6 % (ref 0.0–3.0)
Eosinophils Absolute: 0.1 10*3/uL (ref 0.0–0.7)
Eosinophils Relative: 1.4 % (ref 0.0–5.0)
HCT: 38.9 % (ref 36.0–46.0)
Hemoglobin: 13 g/dL (ref 12.0–15.0)
Lymphocytes Relative: 30.2 % (ref 12.0–46.0)
Lymphs Abs: 1.5 10*3/uL (ref 0.7–4.0)
MCHC: 33.5 g/dL (ref 30.0–36.0)
MCV: 88.6 fl (ref 78.0–100.0)
Monocytes Absolute: 0.5 10*3/uL (ref 0.1–1.0)
Monocytes Relative: 10.2 % (ref 3.0–12.0)
Neutro Abs: 2.9 10*3/uL (ref 1.4–7.7)
Neutrophils Relative %: 57.6 % (ref 43.0–77.0)
Platelets: 219 10*3/uL (ref 150.0–400.0)
RBC: 4.39 Mil/uL (ref 3.87–5.11)
RDW: 13.7 % (ref 11.5–15.5)
WBC: 5 10*3/uL (ref 4.0–10.5)

## 2019-04-25 LAB — LIPID PANEL
Cholesterol: 196 mg/dL (ref 0–200)
HDL: 70.3 mg/dL (ref >39.00)
LDL Cholesterol: 111 mg/dL — ABNORMAL HIGH (ref 0–99)
NonHDL: 126.04
Total CHOL/HDL Ratio: 3
Triglycerides: 76 mg/dL (ref 0.0–149.0)
VLDL: 15.2 mg/dL (ref 0.0–40.0)

## 2019-04-25 LAB — BASIC METABOLIC PANEL
BUN: 9 mg/dL (ref 6–23)
CO2: 27 mEq/L (ref 19–32)
Calcium: 9.4 mg/dL (ref 8.4–10.5)
Chloride: 105 mEq/L (ref 96–112)
Creatinine, Ser: 0.73 mg/dL (ref 0.40–1.20)
GFR: 85 mL/min (ref >60.00)
Glucose, Bld: 87 mg/dL (ref 70–99)
Potassium: 4.5 mEq/L (ref 3.5–5.1)
Sodium: 140 mEq/L (ref 135–145)

## 2019-04-25 LAB — TSH: TSH: 1.23 u[IU]/mL (ref 0.35–4.50)

## 2019-04-25 NOTE — Progress Notes (Signed)
This visit occurred during the SARS-CoV-2 public health emergency.  Safety protocols were in place, including screening questions prior to the visit, additional usage of staff PPE, and extensive cleaning of exam room while observing appropriate contact time as indicated for disinfecting solutions.    Chief Complaint  Patient presents with  . Annual Exam  . Hypertension  . Medication Management    HPI: Patient  Anna Poole  49 y.o. comes in today for Preventive Health Care visit   BP:  Controlled on 10 mg better since off ocps  No contraception used  HA about the same  Sees derm  Neg fam hx colon Gi cancer  Gyne check soon and mammo  Taking lexapro  With help  And rare use of ativan    Health Maintenance  Topic Date Due  . HIV Screening  01/20/1986  . PAP SMEAR-Modifier  04/04/2018  . MAMMOGRAM  09/18/2018  . INFLUENZA VACCINE  10/19/2018  . TETANUS/TDAP  04/24/2029   Health Maintenance Review LIFESTYLE:  Exercise:  Just started back  3 day .  Tobacco/ETS: no Alcohol: very little  Sugar beverages: more that in past not every day .  Sleep: not enough   Still  6 hours  Drug use: no HH of   4 +  Work:  About 36 - 38   Dental hygiene Periods  About monthly    Last  5-7 days.  Has  Took zomig  And not sure if     ROS:  GEN/ HEENT: No fever, significant weight changes sweats headaches vision problems hearing changes, CV/ PULM; No chest pain shortness of breath cough, syncope,edema  change in exercise tolerance. GI /GU: No adominal pain, vomiting, change in bowel habits. No blood in the stool. No significant GU symptoms. SKIN/HEME: ,no acute skin rashes suspicious lesions or bleeding. No lymphadenopathy, nodules, masses.  NEURO/ PSYCH:  No neurologic signs such as weakness numbness. No depression anxiety. IMM/ Allergy: No unusual infections.  Allergy .   REST of 12 system review negative except as per HPI   Past Medical History:  Diagnosis Date  . Anxiety   .  CARDIAC MURMUR 07/12/2007   Qualifier: Diagnosis of  By: Lawernce Ion, CMA (AAMA), Bethann Berkshire   . Headache(784.0)   . Heart murmur    had neg echo and ekg  . History of miscarriage 2009  . Hx of syncope    summer poss heat and hydration  . Hyperkalemia 07/08/2010  . Hypertension    Hosp 11/09 and K 3.2 neg eval, nl echo and neg aldopra ratio  . HYPOKALEMIA, HX OF 02/18/2008   Qualifier: Diagnosis of  By: Fabian Sharp MD, Neta Mends   . IRREGULAR MENSES 09/06/2007   Qualifier: Diagnosis of  By: Lawernce Ion, CMA (AAMA), Bethann Berkshire   . Palpitations   . Panic attack 06/13/2011  . Preeclampsia    ht in pregnancy   . Premenopausal patient 09/06/2017   see report    Past Surgical History:  Procedure Laterality Date  . OVARIAN CYST REMOVAL  1991   [facility]    Family History  Problem Relation Age of Onset  . Hypertension Mother   . Diabetes type II Unknown        ggf  . Stroke Unknown        pgf     Social History   Socioeconomic History  . Marital status: Married    Spouse name: Not on file  . Number of children: Not on file  .  Years of education: Not on file  . Highest education level: Not on file  Occupational History  . Not on file  Tobacco Use  . Smoking status: Never Smoker  . Smokeless tobacco: Never Used  Substance and Sexual Activity  . Alcohol use: Yes  . Drug use: No  . Sexual activity: Not on file  Other Topics Concern  . 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 Difficulty of Paying Living Expenses: Not on file  Food Insecurity:   . Worried About Programme researcher, broadcasting/film/video in the Last Year: Not on file  . Ran Out of Food in the Last Year: Not on file  Transportation Needs:   . Lack of Transportation (Medical): Not on file  . Lack of Transportation  (Non-Medical): Not on file  Physical Activity:   . Days of Exercise per Week: Not on file  . Minutes of Exercise per Session: Not on file  Stress:   . Feeling of Stress : Not on file  Social Connections:   . Frequency of Communication with Friends and Family: Not on file  . Frequency of Social Gatherings with Friends and Family: Not on file  . Attends Religious Services: Not on file  . Active Member of Clubs or Organizations: Not on file  . Attends Banker Meetings: Not on file  . Marital Status: Not on file    Outpatient Medications Prior to Visit  Medication Sig Dispense Refill  . escitalopram (LEXAPRO) 10 MG tablet TAKE 1 TABLET BY MOUTH  DAILY 90 tablet 3  . lisinopril (ZESTRIL) 10 MG tablet TAKE 1/2 TO 1 TABLET BY  MOUTH DAILY 90 tablet 3  . LORazepam (ATIVAN) 0.5 MG tablet TAKE 1 OR 2 TABLETS BY MOUTH EVERY 8 HOURS AS NEEDED FOR ANXIETY 20 tablet 0  . Rimegepant Sulfate (NURTEC) 75 MG TBDP Take by mouth.    . zolmitriptan (ZOMIG) 5 MG tablet Take by mouth.     No facility-administered medications prior to visit.     EXAM:  BP 124/72 (BP Location: Right Arm, Patient Position: Sitting, Cuff Size: Normal)   Pulse 79   Temp 98.4 F (36.9 C)   Ht 5' 3.39" (1.61 m)   Wt 126 lb 4 oz (57.3 kg)   SpO2 97%   BMI 22.09 kg/m   Body mass index is 22.09 kg/m. Wt Readings from Last 3 Encounters:  04/25/19 126 lb 4 oz (57.3 kg)  01/11/18 129 lb 6.4 oz (58.7 kg)  01/10/17 137 lb 8 oz (62.4 kg)    Physical Exam: Vital signs reviewed KDT:OIZT is a well-developed well-nourished alert cooperative    who appearsr stated age in no acute distress.  HEENT: normocephalic atraumatic , Eyes: PERRL EOM's full, conjunctiva clear, Nares: paten,t no deformity discharge or tenderness., Ears: no deformity EAC's clear TMs with normal landmarks. Mouth: masked NECK: supple without masses, thyromegaly or bruits. CHEST/PULM:  Clear to auscultation and percussion breath sounds equal no  wheeze , rales or rhonchi. No chest wall deformities or tenderness. Breast: normal by inspection . No dimpling, discharge, masses, tenderness or discharge . CV: PMI is nondisplaced, S1  S2 no gallops, murmurs, rubs. Peripheral pulses are full without delay.No JVD .  ABDOMEN: Bowel sounds normal nontender  No guard or rebound, no hepato splenomegal no CVA tenderness.   Extremtities:  No clubbing cyanosis or edema, no acute joint swelling or redness no focal atrophy NEURO:  Oriented x3, cranial nerves 3-12 appear to be intact, no obvious focal weakness,gait within normal limits no abnormal reflexes or asymmetrical SKIN: No acute rashes normal turgor, color, no bruising or petechiae. Multiple freckles and moles PSYCH: Oriented, good eye contact, no obvious depression anxiety, cognition and judgment appear normal. LN: no cervical axillary inguinal adenopathy  Lab Results  Component Value Date   WBC 5.0 04/25/2019   HGB 13.0 04/25/2019   HCT 38.9 04/25/2019   PLT 219.0 04/25/2019   GLUCOSE 87 04/25/2019   CHOL 196 04/25/2019   TRIG 76.0 04/25/2019   HDL 70.30 04/25/2019   LDLDIRECT 166.2 03/07/2013   LDLCALC 111 (H) 04/25/2019   ALT 14 04/25/2019   AST 17 04/25/2019   NA 140 04/25/2019   K 4.5 04/25/2019   CL 105 04/25/2019   CREATININE 0.73 04/25/2019   BUN 9 04/25/2019   CO2 27 04/25/2019   TSH 1.23 04/25/2019   INR 1.1 02/12/2008   HGBA1C  02/12/2008    5.2 (NOTE)   The ADA recommends the following therapeutic goal for glycemic   control related to Hgb A1C measurement:   Goal of Therapy:   < 7.0% Hgb A1C   Reference: American Diabetes Association: Clinical Practice   Recommendations 2008, Diabetes Care,  2008, 31:(Suppl 1).    BP Readings from Last 3 Encounters:  04/25/19 124/72  01/11/18 122/78  01/10/17 (!) 146/80    Fasting. Today   ASSESSMENT AND PLAN:  Discussed the following assessment and plan:    ICD-10-CM   1. Visit for preventive health examination  Z00.00  Basic metabolic panel    CBC with Differential/Platelet    Hepatic function panel    Lipid panel    TSH  2. Medication management  Z79.899 Basic metabolic panel    CBC with Differential/Platelet    Hepatic function panel    Lipid panel    TSH  3. Essential hypertension  I10 Basic metabolic panel    CBC with Differential/Platelet    Hepatic function panel    Lipid panel    TSH  4. Need for Tdap vaccination  Z23 Tdap vaccine greater than or equal to 7yo IM  5. Colon cancer screening  Z12.11 Ambulatory referral to Gastroenterology  tdap pap mammo  To be done flu declined at this time  On the schedule.  Refer for colon screening  Same med s bp and mood .  Advised  Do not get preg on  Current meds   Patient Care Team: Elysabeth Aust, Neta Mends, MD as PCP - General Huel Cote, MD (Obstetrics and Gynecology) Annia Belt, MD as Referring Physician (Psychiatry) Patient Instructions  Glad you are doing well.  Bp is  At goal and remain on same meds .   Can try taking lexapro earlier in evenings  Or in am  To see if any effect on sleep.  Will notify you  of labs when available.  You will be contacted about getting colonoscopy for routine screening  If all ok then yearly cpx    Health Maintenance, Female Adopting a healthy lifestyle and getting preventive care are important in promoting health and wellness. Ask your health care provider about:  The right  schedule for you to have regular tests and exams.  Things you can do on your own to prevent diseases and keep yourself healthy. What should I know about diet, weight, and exercise? Eat a healthy diet   Eat a diet that includes plenty of vegetables, fruits, low-fat dairy products, and lean protein.  Do not eat a lot of foods that are high in solid fats, added sugars, or sodium. Maintain a healthy weight Body mass index (BMI) is used to identify weight problems. It estimates body fat based on height and weight. Your health care  provider can help determine your BMI and help you achieve or maintain a healthy weight. Get regular exercise Get regular exercise. This is one of the most important things you can do for your health. Most adults should:  Exercise for at least 150 minutes each week. The exercise should increase your heart rate and make you sweat (moderate-intensity exercise).  Do strengthening exercises at least twice a week. This is in addition to the moderate-intensity exercise.  Spend less time sitting. Even light physical activity can be beneficial. Watch cholesterol and blood lipids Have your blood tested for lipids and cholesterol at 49 years of age, then have this test every 5 years. Have your cholesterol levels checked more often if:  Your lipid or cholesterol levels are high.  You are older than 49 years of age.  You are at high risk for heart disease. What should I know about cancer screening? Depending on your health history and family history, you may need to have cancer screening at various ages. This may include screening for:  Breast cancer.  Cervical cancer.  Colorectal cancer.  Skin cancer.  Lung cancer. What should I know about heart disease, diabetes, and high blood pressure? Blood pressure and heart disease  High blood pressure causes heart disease and increases the risk of stroke. This is more likely to develop in people who have high blood pressure readings, are of African descent, or are overweight.  Have your blood pressure checked: ? Every 3-5 years if you are 59-72 years of age. ? Every year if you are 65 years old or older. Diabetes Have regular diabetes screenings. This checks your fasting blood sugar level. Have the screening done:  Once every three years after age 36 if you are at a normal weight and have a low risk for diabetes.  More often and at a younger age if you are overweight or have a high risk for diabetes. What should I know about preventing  infection? Hepatitis B If you have a higher risk for hepatitis B, you should be screened for this virus. Talk with your health care provider to find out if you are at risk for hepatitis B infection. Hepatitis C Testing is recommended for:  Everyone born from [birth year] through 1965.  Anyone with known risk factors for hepatitis C. Sexually transmitted infections (STIs)  Get screened for STIs, including gonorrhea and chlamydia, if: ? You are sexually active and are younger than 49 years of age. ? You are older than 49 years of age and your health care provider tells you that you are at risk for this type of infection. ? Your sexual activity has changed since you were last screened, and you are at increased risk for chlamydia or gonorrhea. Ask your health care provider if you are at risk.  Ask your health care provider about whether you are at high risk for HIV. Your health care provider may recommend a  prescription medicine to help prevent HIV infection. If you choose to take medicine to prevent HIV, you should first get tested for HIV. You should then be tested every 3 months for as long as you are taking the medicine. Pregnancy  If you are about to stop having your period (premenopausal) and you may become pregnant, seek counseling before you get pregnant.  Take 400 to 800 micrograms (mcg) of folic acid every day if you become pregnant.  Ask for birth control (contraception) if you want to prevent pregnancy. Osteoporosis and menopause Osteoporosis is a disease in which the bones lose minerals and strength with aging. This can result in bone fractures. If you are 3 years old or older, or if you are at risk for osteoporosis and fractures, ask your health care provider if you should:  Be screened for bone loss.  Take a calcium or vitamin D supplement to lower your risk of fractures.  Be given hormone replacement therapy (HRT) to treat symptoms of menopause. Follow these instructions at home:  Lifestyle  Do not use any products that contain nicotine or tobacco, such as cigarettes, e-cigarettes, and chewing tobacco. If you need help quitting, ask your health care provider.  Do not use street drugs.  Do not share needles.  Ask your health care provider for help if you need support or information about quitting drugs. Alcohol use  Do not drink alcohol if: ? Your health care provider tells you not to drink. ? You are pregnant, may be pregnant, or are planning to become pregnant.  If you drink alcohol: ? Limit how much you use to 0-1 drink a day. ? Limit intake if you are breastfeeding.  Be aware of how much alcohol is in your drink. In the [place] one drink equals one 12 oz bottle of beer (355 mL), one 5 oz glass of wine (148 mL), or one 1 oz glass of hard liquor (44 mL). General instructions  Schedule regular health, dental, and eye exams.  Stay current with your vaccines.  Tell your health care provider if: ? You often feel depressed. ? You have ever been abused or do not feel safe at home. Summary  Adopting a healthy lifestyle and getting preventive care are important in promoting health and wellness.  Follow your health care provider's instructions about healthy diet, exercising, and getting tested or screened for diseases.  Follow your health care provider's instructions on monitoring your cholesterol and blood pressure. This information is not intended to replace advice given to you by your health care provider. Make sure you discuss any questions you have with your health care provider. Document Revised: 02/27/2018 Document Reviewed: 02/27/2018 Elsevier Patient Education  2020 [place] Armanii Pressnell M.D.

## 2019-04-25 NOTE — Patient Instructions (Signed)
Glad you are doing well.  Bp is  At goal and remain on same meds .   Can try taking lexapro earlier in evenings  Or in am  To see if any effect on sleep.  Will notify you  of labs when available.  You will be contacted about getting colonoscopy for routine screening  If all ok then yearly cpx    Health Maintenance, Female Adopting a healthy lifestyle and getting preventive care are important in promoting health and wellness. Ask your health care provider about:  The right schedule for you to have regular tests and exams.  Things you can do on your own to prevent diseases and keep yourself healthy. What should I know about diet, weight, and exercise? Eat a healthy diet   Eat a diet that includes plenty of vegetables, fruits, low-fat dairy products, and lean protein.  Do not eat a lot of foods that are high in solid fats, added sugars, or sodium. Maintain a healthy weight Body mass index (BMI) is used to identify weight problems. It estimates body fat based on height and weight. Your health care provider can help determine your BMI and help you achieve or maintain a healthy weight. Get regular exercise Get regular exercise. This is one of the most important things you can do for your health. Most adults should:  Exercise for at least 150 minutes each week. The exercise should increase your heart rate and make you sweat (moderate-intensity exercise).  Do strengthening exercises at least twice a week. This is in addition to the moderate-intensity exercise.  Spend less time sitting. Even light physical activity can be beneficial. Watch cholesterol and blood lipids Have your blood tested for lipids and cholesterol at 49 years of age, then have this test every 5 years. Have your cholesterol levels checked more often if:  Your lipid or cholesterol levels are high.  You are older than 50 years of age.  You are at high risk for heart disease. What should I know about cancer  screening? Depending on your health history and family history, you may need to have cancer screening at various ages. This may include screening for:  Breast cancer.  Cervical cancer.  Colorectal cancer.  Skin cancer.  Lung cancer. What should I know about heart disease, diabetes, and high blood pressure? Blood pressure and heart disease  High blood pressure causes heart disease and increases the risk of stroke. This is more likely to develop in people who have high blood pressure readings, are of African descent, or are overweight.  Have your blood pressure checked: ? Every 3-5 years if you are 17-40 years of age. ? Every year if you are 38 years old or older. Diabetes Have regular diabetes screenings. This checks your fasting blood sugar level. Have the screening done:  Once every three years after age 73 if you are at a normal weight and have a low risk for diabetes.  More often and at a younger age if you are overweight or have a high risk for diabetes. What should I know about preventing infection? Hepatitis B If you have a higher risk for hepatitis B, you should be screened for this virus. Talk with your health care provider to find out if you are at risk for hepatitis B infection. Hepatitis C Testing is recommended for:  Everyone born from [birth year] through 1965.  Anyone with known risk factors for hepatitis C. Sexually transmitted infections (STIs)  Get screened for STIs, including gonorrhea and  chlamydia, if: ? You are sexually active and are younger than 49 years of age. ? You are older than 49 years of age and your health care provider tells you that you are at risk for this type of infection. ? Your sexual activity has changed since you were last screened, and you are at increased risk for chlamydia or gonorrhea. Ask your health care provider if you are at risk.  Ask your health care provider about whether you are at high risk for HIV. Your health care provider may  recommend a prescription medicine to help prevent HIV infection. If you choose to take medicine to prevent HIV, you should first get tested for HIV. You should then be tested every 3 months for as long as you are taking the medicine. Pregnancy  If you are about to stop having your period (premenopausal) and you may become pregnant, seek counseling before you get pregnant.  Take 400 to 800 micrograms (mcg) of folic acid every day if you become pregnant.  Ask for birth control (contraception) if you want to prevent pregnancy. Osteoporosis and menopause Osteoporosis is a disease in which the bones lose minerals and strength with aging. This can result in bone fractures. If you are 69 years old or older, or if you are at risk for osteoporosis and fractures, ask your health care provider if you should:  Be screened for bone loss.  Take a calcium or vitamin D supplement to lower your risk of fractures.  Be given hormone replacement therapy (HRT) to treat symptoms of menopause. Follow these instructions at home: Lifestyle  Do not use any products that contain nicotine or tobacco, such as cigarettes, e-cigarettes, and chewing tobacco. If you need help quitting, ask your health care provider.  Do not use street drugs.  Do not share needles.  Ask your health care provider for help if you need support or information about quitting drugs. Alcohol use  Do not drink alcohol if: ? Your health care provider tells you not to drink. ? You are pregnant, may be pregnant, or are planning to become pregnant.  If you drink alcohol: ? Limit how much you use to 0-1 drink a day. ? Limit intake if you are breastfeeding.  Be aware of how much alcohol is in your drink. In the [place] one drink equals one 12 oz bottle of beer (355 mL), one 5 oz glass of wine (148 mL), or one 1 oz glass of hard liquor (44 mL). General instructions  Schedule regular health, dental, and eye exams.  Stay current with your  vaccines.  Tell your health care provider if: ? You often feel depressed. ? You have ever been abused or do not feel safe at home. Summary  Adopting a healthy lifestyle and getting preventive care are important in promoting health and wellness.  Follow your health care provider's instructions about healthy diet, exercising, and getting tested or screened for diseases.  Follow your health care provider's instructions on monitoring your cholesterol and blood pressure. This information is not intended to replace advice given to you by your health care provider. Make sure you discuss any questions you have with your health care provider. Document Revised: 02/27/2018 Document Reviewed: 02/27/2018 Elsevier Patient Education  2020 [employer].

## 2019-05-09 LAB — HM PAP SMEAR: HM Pap smear: NEGATIVE

## 2020-03-19 ENCOUNTER — Other Ambulatory Visit: Payer: Self-pay | Admitting: Internal Medicine

## 2020-05-01 ENCOUNTER — Other Ambulatory Visit: Payer: Self-pay | Admitting: Internal Medicine

## 2020-05-04 NOTE — Telephone Encounter (Signed)
Has to be seen for future refills

## 2020-05-16 ENCOUNTER — Other Ambulatory Visit: Payer: Self-pay | Admitting: Internal Medicine

## 2020-06-27 ENCOUNTER — Other Ambulatory Visit: Payer: Self-pay | Admitting: Internal Medicine

## 2020-07-25 ENCOUNTER — Other Ambulatory Visit: Payer: Self-pay | Admitting: Internal Medicine

## 2020-08-22 ENCOUNTER — Other Ambulatory Visit: Payer: Self-pay | Admitting: Internal Medicine

## 2020-09-04 ENCOUNTER — Other Ambulatory Visit: Payer: Self-pay | Admitting: Internal Medicine

## 2020-09-12 NOTE — Progress Notes (Signed)
Chief Complaint  Patient presents with   Annual Exam     HPI: Patient  Anna JordanJanice L Poole  50 y.o. comes in today for Preventive Health Care visit  And medication evaluation is on Lexapro and feels that it is quite helpful would like to remain on it Is now perimenopausal last menstrual period February some night flashes.  Working memory is slowed. Managing all blood pressures been good no panic attacks. Under care by neurology and the Nurtec is quite helpful for her headaches and Flexeril as needed for neck pain that can trigger headaches she is doing much better.  Father  passed in November  and f in law in hospital .    Health Maintenance  Topic Date Due   Pneumococcal Vaccine 270-50 Years old (1 - PCV) Never done   HIV Screening  Never done   Hepatitis C Screening  Never done   COLONOSCOPY (Pts 45-7449yrs Insurance coverage will need to be confirmed)  Never done   PAP SMEAR-Modifier  04/04/2018   MAMMOGRAM  09/18/2018   INFLUENZA VACCINE  10/18/2020   TETANUS/TDAP  04/24/2029   HPV VACCINES  Aged Out   COVID-19 Vaccine  Discontinued   Health Maintenance Review LIFESTYLE:  Exercise:  not as good recently walks dog  active . Tobacco/ETS:no Alcohol: social Sugar beverages: ocass soda.  Sleep: about 6 hours perimenopausal flushes  lmp 2 2022  Drug use: no HH of  2+  ( 5 )    dog  Work: 36- 38 hours     ROS:  REST of 12 system review negative except as per HPI   Past Medical History:  Diagnosis Date   Anxiety    CARDIAC MURMUR 07/12/2007   Qualifier: Diagnosis of  By: Lawernce Ionranford, CMA (AAMA), Bethann BerkshireShannon S    Headache(784.0)    Heart murmur    had neg echo and ekg   History of miscarriage 2009   Hx of syncope    summer poss heat and hydration   Hyperkalemia 07/08/2010   Hypertension    Hosp 11/09 and K 3.2 neg eval, nl echo and neg aldopra ratio   HYPOKALEMIA, HX OF 02/18/2008   Qualifier: Diagnosis of  By: Fabian SharpPanosh MD, Neta MendsWanda K    IRREGULAR MENSES 09/06/2007   Qualifier:  Diagnosis of  By: Lawernce Ionranford, CMA (AAMA), Shannon S    Palpitations    Panic attack 06/13/2011   Preeclampsia    ht in pregnancy    Premenopausal patient 09/06/2017   see report    Past Surgical History:  Procedure Laterality Date   OVARIAN CYST REMOVAL  1991   [facility]    Family History  Problem Relation Age of Onset   Hypertension Mother    Diabetes type II Unknown        ggf   Stroke Unknown        pgf     Social History   Socioeconomic History   Marital status: Married    Spouse name: Not on file   Number of children: Not on file   Years of education: Not on file   Highest education level: Not on file  Occupational History   Not on file  Tobacco Use   Smoking status: Never   Smokeless tobacco: Never  Substance and Sexual Activity   Alcohol use: Yes   Drug use: No   Sexual activity: Not on file  Other Topics Concern   Not on file  Social History Narrative   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     escitalopram (LEXAPRO) 10 MG tablet TAKE 1 TABLET BY MOUTH  DAILY 30 tablet 1   lisinopril (ZESTRIL) 10 MG tablet TAKE 1/2 TO 1 TABLET BY  MOUTH DAILY NEED  APPOINTMENT BEFORE NEXT  REFILL 60 tablet 0   zolmitriptan (ZOMIG) 5 MG tablet Take by mouth.     No facility-administered medications prior to visit.     EXAM:  BP 124/80 (BP Location: Left Arm, Patient Position:  Sitting, Cuff Size: Normal)   Pulse 79   Temp 98.4 F (36.9 C) (Oral)   Ht 5\' 4"  (1.626 m)   Wt 120 lb 6.4 oz (54.6 kg)   SpO2 99%   BMI 20.67 kg/m   Body mass index is 20.67 kg/m. Wt Readings from Last 3 Encounters:  09/13/20 120 lb 6.4 oz (54.6 kg)  04/25/19 126 lb 4 oz (57.3 kg)  01/11/18 129 lb 6.4 oz (58.7 kg)    Physical Exam: Vital signs reviewed 01/13/18 is a well-developed well-nourished alert cooperative    who appearsr stated age in no acute distress.  HEENT: normocephalic atraumatic , Eyes: PERRL EOM's full, conjunctiva clear, Nares: paten,t no deformity discharge or tenderness., Ears: no deformity EAC's clear TMs with normal landmarks. Mouth: masked  NECK: supple without masses, thyromegaly or bruits. CHEST/PULM:  Clear to auscultation and percussion breath sounds equal no wheeze , rales or rhonchi. No chest wall deformities or tenderness. Breast: normal by inspection . No dimpling, discharge, masses, tenderness or discharge . CV: PMI is nondisplaced, S1 S2 no gallops, murmurs, rubs. Peripheral pulses are full without delay.No JVD .  ABDOMEN: Bowel sounds normal nontender  No guard or rebound, no hepato splenomegal no CVA tenderness.  No hernia. Extremtities:  No clubbing cyanosis or edema, no acute joint swelling or redness no focal atrophy NEURO:  Oriented x3, cranial nerves 3-12 appear to be intact, no obvious focal weakness,gait within normal limits no abnormal reflexes or asymmetrical SKIN: No acute rashes normal turgor, color, no bruising or petechiae. PSYCH: Oriented, good eye contact, no obvious depression anxiety, cognition and judgment appear normal. LN: no cervical axillary inguinal adenopathy  Lab Results  Component Value Date   WBC 5.0 04/25/2019   HGB 13.0 04/25/2019   HCT 38.9 04/25/2019   PLT 219.0 04/25/2019   GLUCOSE 87 04/25/2019   CHOL 196 04/25/2019   TRIG 76.0 04/25/2019   HDL 70.30 04/25/2019   LDLDIRECT 166.2 03/07/2013   LDLCALC 111  (H) 04/25/2019   ALT 14 04/25/2019   AST 17 04/25/2019   NA 140 04/25/2019   K 4.5 04/25/2019   CL 105 04/25/2019   CREATININE 0.73 04/25/2019   BUN 9 04/25/2019   CO2 27 04/25/2019  TSH 1.23 04/25/2019   INR 1.1 02/12/2008   HGBA1C  02/12/2008    5.2 (NOTE)   The ADA recommends the following therapeutic goal for glycemic   control related to Hgb A1C measurement:   Goal of Therapy:   < 7.0% Hgb A1C   Reference: American Diabetes Association: Clinical Practice   Recommendations 2008, Diabetes Care,  2008, 31:(Suppl 1).    BP Readings from Last 3 Encounters:  09/13/20 124/80  04/25/19 124/72  01/11/18 122/78   Ate last night is fasting Lab plan reviewed with patient   ASSESSMENT AND PLAN:  Discussed the following assessment and plan:    ICD-10-CM   1. Visit for preventive health examination  Z00.00 Basic metabolic panel    CBC with Differential/Platelet    Hepatic function panel    Lipid panel    TSH    TSH    Lipid panel    Hepatic function panel    CBC with Differential/Platelet    Basic metabolic panel    2. Medication management  Z79.899 Basic metabolic panel    CBC with Differential/Platelet    Hepatic function panel    Lipid panel    TSH    TSH    Lipid panel    Hepatic function panel    CBC with Differential/Platelet    Basic metabolic panel    3. Essential hypertension  I10 Basic metabolic panel    CBC with Differential/Platelet    Hepatic function panel    Lipid panel    TSH    TSH    Lipid panel    Hepatic function panel    CBC with Differential/Platelet    Basic metabolic panel    4. Colon cancer screening  Z12.11 Ambulatory referral to Gastroenterology    5. Perimenopausal  N95.1     6. Anxiety state  F41.1     She is perimenopausal Lexapro was probably helpful parameters at goal continue medication Will refer for colonoscopy colon cancer screening seed with mammogram plan CPX labs in medicine follow-up in 1 year or as needed. Return  in about 1 year (around 09/13/2021) for preventive /cpx and medications.  Patient Care Team: Madelin Headings, MD as PCP - General Huel Cote, MD (Obstetrics and Gynecology) Annia Belt, MD as Referring Physician (Psychiatry) Patient Instructions  Exam is normal . Good to see you today .  Will notify you  of labs when available.  Will do colon cancer colon referral .   Get your mammogram when possible.   Health Maintenance, Female Adopting a healthy lifestyle and getting preventive care are important in promoting health and wellness. Ask your health care provider about: The right schedule for you to have regular tests and exams. Things you can do on your own to prevent diseases and keep yourself healthy. What should I know about diet, weight, and exercise? Eat a healthy diet  Eat a diet that includes plenty of vegetables, fruits, low-fat dairy products, and lean protein. Do not eat a lot of foods that are high in solid fats, added sugars, or sodium.  Maintain a healthy weight Body mass index (BMI) is used to identify weight problems. It estimates body fat based on height and weight. Your health care provider can help determineyour BMI and help you achieve or maintain a healthy weight. Get regular exercise Get regular exercise. This is one of the most important things you can do for your health. Most adults should: Exercise for at least 150 minutes  each week. The exercise should increase your heart rate and make you sweat (moderate-intensity exercise). Do strengthening exercises at least twice a week. This is in addition to the moderate-intensity exercise. Spend less time sitting. Even light physical activity can be beneficial. Watch cholesterol and blood lipids Have your blood tested for lipids and cholesterol at 50 years of age, then havethis test every 5 years. Have your cholesterol levels checked more often if: Your lipid or cholesterol levels are high. You are older  than 51 years of age. You are at high risk for heart disease. What should I know about cancer screening? Depending on your health history and family history, you may need to have cancer screening at various ages. This may include screening for: Breast cancer. Cervical cancer. Colorectal cancer. Skin cancer. Lung cancer. What should I know about heart disease, diabetes, and high blood pressure? Blood pressure and heart disease High blood pressure causes heart disease and increases the risk of stroke. This is more likely to develop in people who have high blood pressure readings, are of African descent, or are overweight. Have your blood pressure checked: Every 3-5 years if you are 25-82 years of age. Every year if you are 80 years old or older. Diabetes Have regular diabetes screenings. This checks your fasting blood sugar level. Have the screening done: Once every three years after age 72 if you are at a normal weight and have a low risk for diabetes. More often and at a younger age if you are overweight or have a high risk for diabetes. What should I know about preventing infection? Hepatitis B If you have a higher risk for hepatitis B, you should be screened for this virus. Talk with your health care provider to find out if you are at risk forhepatitis B infection. Hepatitis C Testing is recommended for: Everyone born from [birth year] through 1965. Anyone with known risk factors for hepatitis C. Sexually transmitted infections (STIs) Get screened for STIs, including gonorrhea and chlamydia, if: You are sexually active and are younger than 50 years of age. You are older than 50 years of age and your health care provider tells you that you are at risk for this type of infection. Your sexual activity has changed since you were last screened, and you are at increased risk for chlamydia or gonorrhea. Ask your health care provider if you are at risk. Ask your health care provider about whether  you are at high risk for HIV. Your health care provider may recommend a prescription medicine to help prevent HIV infection. If you choose to take medicine to prevent HIV, you should first get tested for HIV. You should then be tested every 3 months for as long as you are taking the medicine. Pregnancy If you are about to stop having your period (premenopausal) and you may become pregnant, seek counseling before you get pregnant. Take 400 to 800 micrograms (mcg) of folic acid every day if you become pregnant. Ask for birth control (contraception) if you want to prevent pregnancy. Osteoporosis and menopause Osteoporosis is a disease in which the bones lose minerals and strength with aging. This can result in bone fractures. If you are 8 years old or older, or if you are at risk for osteoporosis and fractures, ask your health care provider if you should: Be screened for bone loss. Take a calcium or vitamin D supplement to lower your risk of fractures. Be given hormone replacement therapy (HRT) to treat symptoms of menopause. Follow these  instructions at home: Lifestyle Do not use any products that contain nicotine or tobacco, such as cigarettes, e-cigarettes, and chewing tobacco. If you need help quitting, ask your health care provider. Do not use street drugs. Do not share needles. Ask your health care provider for help if you need support or information about quitting drugs. Alcohol use Do not drink alcohol if: Your health care provider tells you not to drink. You are pregnant, may be pregnant, or are planning to become pregnant. If you drink alcohol: Limit how much you use to 0-1 drink a day. Limit intake if you are breastfeeding. Be aware of how much alcohol is in your drink. In the [place] one drink equals one 12 oz bottle of beer (355 mL), one 5 oz glass of wine (148 mL), or one 1 oz glass of hard liquor (44 mL). General instructions Schedule regular health, dental, and eye exams. Stay  current with your vaccines. Tell your health care provider if: You often feel depressed. You have ever been abused or do not feel safe at home. Summary Adopting a healthy lifestyle and getting preventive care are important in promoting health and wellness. Follow your health care provider's instructions about healthy diet, exercising, and getting tested or screened for diseases. Follow your health care provider's instructions on monitoring your cholesterol and blood pressure. This information is not intended to replace advice given to you by your health care provider. Make sure you discuss any questions you have with your healthcare provider. Document Revised: 02/27/2018 Document Reviewed: 02/27/2018 Elsevier Patient Education  2022 [employer].  [place]. Khaleed Holan M.D.

## 2020-09-13 ENCOUNTER — Encounter: Payer: Self-pay | Admitting: Internal Medicine

## 2020-09-13 ENCOUNTER — Ambulatory Visit (INDEPENDENT_AMBULATORY_CARE_PROVIDER_SITE_OTHER): Payer: BC Managed Care – PPO | Admitting: Internal Medicine

## 2020-09-13 ENCOUNTER — Other Ambulatory Visit: Payer: Self-pay

## 2020-09-13 VITALS — BP 124/80 | HR 79 | Temp 98.4°F | Ht 64.0 in | Wt 120.4 lb

## 2020-09-13 DIAGNOSIS — Z1211 Encounter for screening for malignant neoplasm of colon: Secondary | ICD-10-CM

## 2020-09-13 DIAGNOSIS — Z79899 Other long term (current) drug therapy: Secondary | ICD-10-CM

## 2020-09-13 DIAGNOSIS — Z0000 Encounter for general adult medical examination without abnormal findings: Secondary | ICD-10-CM | POA: Diagnosis not present

## 2020-09-13 DIAGNOSIS — N951 Menopausal and female climacteric states: Secondary | ICD-10-CM

## 2020-09-13 DIAGNOSIS — I10 Essential (primary) hypertension: Secondary | ICD-10-CM | POA: Diagnosis not present

## 2020-09-13 DIAGNOSIS — F411 Generalized anxiety disorder: Secondary | ICD-10-CM

## 2020-09-13 LAB — CBC WITH DIFFERENTIAL/PLATELET
Basophils Absolute: 0 10*3/uL (ref 0.0–0.1)
Basophils Relative: 0.8 % (ref 0.0–3.0)
Eosinophils Absolute: 0.1 10*3/uL (ref 0.0–0.7)
Eosinophils Relative: 1.8 % (ref 0.0–5.0)
HCT: 37.6 % (ref 36.0–46.0)
Hemoglobin: 12.9 g/dL (ref 12.0–15.0)
Lymphocytes Relative: 38.8 % (ref 12.0–46.0)
Lymphs Abs: 1.8 10*3/uL (ref 0.7–4.0)
MCHC: 34.2 g/dL (ref 30.0–36.0)
MCV: 88.6 fl (ref 78.0–100.0)
Monocytes Absolute: 0.5 10*3/uL (ref 0.1–1.0)
Monocytes Relative: 10.6 % (ref 3.0–12.0)
Neutro Abs: 2.2 10*3/uL (ref 1.4–7.7)
Neutrophils Relative %: 48 % (ref 43.0–77.0)
Platelets: 234 10*3/uL (ref 150.0–400.0)
RBC: 4.25 Mil/uL (ref 3.87–5.11)
RDW: 13.3 % (ref 11.5–15.5)
WBC: 4.6 10*3/uL (ref 4.0–10.5)

## 2020-09-13 LAB — BASIC METABOLIC PANEL
BUN: 15 mg/dL (ref 6–23)
CO2: 28 mEq/L (ref 19–32)
Calcium: 9.7 mg/dL (ref 8.4–10.5)
Chloride: 103 mEq/L (ref 96–112)
Creatinine, Ser: 0.77 mg/dL (ref 0.40–1.20)
GFR: 90.44 mL/min (ref >60.00)
Glucose, Bld: 81 mg/dL (ref 70–99)
Potassium: 4.1 mEq/L (ref 3.5–5.1)
Sodium: 140 mEq/L (ref 135–145)

## 2020-09-13 LAB — TSH: TSH: 1.45 u[IU]/mL (ref 0.35–4.50)

## 2020-09-13 LAB — HEPATIC FUNCTION PANEL
ALT: 14 U/L (ref 0–35)
AST: 18 U/L (ref 0–37)
Albumin: 4.4 g/dL (ref 3.5–5.2)
Alkaline Phosphatase: 48 U/L (ref 39–117)
Bilirubin, Direct: 0.1 mg/dL (ref 0.0–0.3)
Total Bilirubin: 0.4 mg/dL (ref 0.2–1.2)
Total Protein: 6.7 g/dL (ref 6.0–8.3)

## 2020-09-13 LAB — LIPID PANEL
Cholesterol: 212 mg/dL — ABNORMAL HIGH (ref 0–200)
HDL: 79.6 mg/dL (ref >39.00)
LDL Cholesterol: 115 mg/dL — ABNORMAL HIGH (ref 0–99)
NonHDL: 132.81
Total CHOL/HDL Ratio: 3
Triglycerides: 90 mg/dL (ref 0.0–149.0)
VLDL: 18 mg/dL (ref 0.0–40.0)

## 2020-09-13 MED ORDER — ESCITALOPRAM OXALATE 10 MG PO TABS: 10.0000 mg | ORAL_TABLET | Freq: Every day | ORAL | 3 refills | Status: DC

## 2020-09-13 MED ORDER — LISINOPRIL 10 MG PO TABS: ORAL_TABLET | ORAL | 3 refills | Status: DC

## 2020-09-13 NOTE — Progress Notes (Signed)
Cholesterol slightly higher than last year but still favorable. And low 10 year risk  Rest of lab results are normal  The 10-year ASCVD risk score Denman George DC Montez Hageman., et al., 2013) is: 1%   Values used to calculate the score:     Age: 50 years     Sex: Female     Is Non-Hispanic African American: No     Diabetic: No     Tobacco smoker: No     Systolic Blood Pressure: 124 mmHg     Is BP treated: Yes     HDL Cholesterol: 79.6 mg/dL     Total Cholesterol: 212 mg/dL

## 2020-09-13 NOTE — Patient Instructions (Signed)
Exam is normal . Good to see you today .  Will notify you  of labs when available.  Will do colon cancer colon referral .   Get your mammogram when possible.   Health Maintenance, Female Adopting a healthy lifestyle and getting preventive care are important in promoting health and wellness. Ask your health care provider about: The right schedule for you to have regular tests and exams. Things you can do on your own to prevent diseases and keep yourself healthy. What should I know about diet, weight, and exercise? Eat a healthy diet  Eat a diet that includes plenty of vegetables, fruits, low-fat dairy products, and lean protein. Do not eat a lot of foods that are high in solid fats, added sugars, or sodium.  Maintain a healthy weight Body mass index (BMI) is used to identify weight problems. It estimates body fat based on height and weight. Your health care provider can help determineyour BMI and help you achieve or maintain a healthy weight. Get regular exercise Get regular exercise. This is one of the most important things you can do for your health. Most adults should: Exercise for at least 150 minutes each week. The exercise should increase your heart rate and make you sweat (moderate-intensity exercise). Do strengthening exercises at least twice a week. This is in addition to the moderate-intensity exercise. Spend less time sitting. Even light physical activity can be beneficial. Watch cholesterol and blood lipids Have your blood tested for lipids and cholesterol at 50 years of age, then havethis test every 5 years. Have your cholesterol levels checked more often if: Your lipid or cholesterol levels are high. You are older than 50 years of age. You are at high risk for heart disease. What should I know about cancer screening? Depending on your health history and family history, you may need to have cancer screening at various ages. This may include screening for: Breast  cancer. Cervical cancer. Colorectal cancer. Skin cancer. Lung cancer. What should I know about heart disease, diabetes, and high blood pressure? Blood pressure and heart disease High blood pressure causes heart disease and increases the risk of stroke. This is more likely to develop in people who have high blood pressure readings, are of African descent, or are overweight. Have your blood pressure checked: Every 3-5 years if you are 50-52 years of age. Every year if you are 9 years old or older. Diabetes Have regular diabetes screenings. This checks your fasting blood sugar level. Have the screening done: Once every three years after age 50 if you are at a normal weight and have a low risk for diabetes. More often and at a younger age if you are overweight or have a high risk for diabetes. What should I know about preventing infection? Hepatitis B If you have a higher risk for hepatitis B, you should be screened for this virus. Talk with your health care provider to find out if you are at risk forhepatitis B infection. Hepatitis C Testing is recommended for: Everyone born from [birth year] through 1965. Anyone with known risk factors for hepatitis C. Sexually transmitted infections (STIs) Get screened for STIs, including gonorrhea and chlamydia, if: You are sexually active and are younger than 50 years of age. You are older than 50 years of age and your health care provider tells you that you are at risk for this type of infection. Your sexual activity has changed since you were last screened, and you are at increased risk for chlamydia or  gonorrhea. Ask your health care provider if you are at risk. Ask your health care provider about whether you are at high risk for HIV. Your health care provider may recommend a prescription medicine to help prevent HIV infection. If you choose to take medicine to prevent HIV, you should first get tested for HIV. You should then be tested every 3 months for as  long as you are taking the medicine. Pregnancy If you are about to stop having your period (premenopausal) and you may become pregnant, seek counseling before you get pregnant. Take 400 to 800 micrograms (mcg) of folic acid every day if you become pregnant. Ask for birth control (contraception) if you want to prevent pregnancy. Osteoporosis and menopause Osteoporosis is a disease in which the bones lose minerals and strength with aging. This can result in bone fractures. If you are 36 years old or older, or if you are at risk for osteoporosis and fractures, ask your health care provider if you should: Be screened for bone loss. Take a calcium or vitamin D supplement to lower your risk of fractures. Be given hormone replacement therapy (HRT) to treat symptoms of menopause. Follow these instructions at home: Lifestyle Do not use any products that contain nicotine or tobacco, such as cigarettes, e-cigarettes, and chewing tobacco. If you need help quitting, ask your health care provider. Do not use street drugs. Do not share needles. Ask your health care provider for help if you need support or information about quitting drugs. Alcohol use Do not drink alcohol if: Your health care provider tells you not to drink. You are pregnant, may be pregnant, or are planning to become pregnant. If you drink alcohol: Limit how much you use to 0-1 drink a day. Limit intake if you are breastfeeding. Be aware of how much alcohol is in your drink. In the [place] one drink equals one 12 oz bottle of beer (355 mL), one 5 oz glass of wine (148 mL), or one 1 oz glass of hard liquor (44 mL). General instructions Schedule regular health, dental, and eye exams. Stay current with your vaccines. Tell your health care provider if: You often feel depressed. You have ever been abused or do not feel safe at home. Summary Adopting a healthy lifestyle and getting preventive care are important in promoting health and  wellness. Follow your health care provider's instructions about healthy diet, exercising, and getting tested or screened for diseases. Follow your health care provider's instructions on monitoring your cholesterol and blood pressure. This information is not intended to replace advice given to you by your health care provider. Make sure you discuss any questions you have with your healthcare provider. Document Revised: 02/27/2018 Document Reviewed: 02/27/2018 Elsevier Patient Education  2022 [employer].

## 2020-12-24 LAB — HM MAMMOGRAPHY

## 2021-05-27 ENCOUNTER — Other Ambulatory Visit: Payer: Self-pay | Admitting: Internal Medicine

## 2021-05-27 NOTE — Telephone Encounter (Signed)
Last Ov 6/27/222 ?Filled 01/11/18 ?Is it ok to refill? ?

## 2021-05-29 MED ORDER — LORAZEPAM 0.5 MG PO TABS: ORAL_TABLET | ORAL | 0 refills | Status: DC

## 2021-08-03 ENCOUNTER — Other Ambulatory Visit: Payer: Self-pay | Admitting: Internal Medicine

## 2021-11-23 ENCOUNTER — Other Ambulatory Visit: Payer: Self-pay | Admitting: Internal Medicine

## 2022-02-20 ENCOUNTER — Encounter: Payer: BC Managed Care – PPO | Admitting: Internal Medicine

## 2022-03-10 ENCOUNTER — Encounter: Payer: Self-pay | Admitting: Internal Medicine

## 2022-03-14 ENCOUNTER — Encounter: Payer: BC Managed Care – PPO | Admitting: Internal Medicine

## 2022-03-15 ENCOUNTER — Encounter: Payer: Self-pay | Admitting: Internal Medicine

## 2022-03-30 ENCOUNTER — Encounter: Payer: Self-pay | Admitting: Family Medicine

## 2022-03-30 ENCOUNTER — Ambulatory Visit (INDEPENDENT_AMBULATORY_CARE_PROVIDER_SITE_OTHER): Payer: BC Managed Care – PPO | Admitting: Family Medicine

## 2022-03-30 VITALS — BP 128/90 | HR 73 | Temp 98.5°F | Wt 130.2 lb

## 2022-03-30 DIAGNOSIS — J0140 Acute pansinusitis, unspecified: Secondary | ICD-10-CM | POA: Diagnosis not present

## 2022-03-30 DIAGNOSIS — G43809 Other migraine, not intractable, without status migrainosus: Secondary | ICD-10-CM

## 2022-03-30 MED ORDER — AMOXICILLIN-POT CLAVULANATE 500-125 MG PO TABS: 1.0000 | ORAL_TABLET | Freq: Two times a day (BID) | ORAL | 0 refills | Status: AC

## 2022-03-30 MED ORDER — ZOLMITRIPTAN 5 MG PO TABS: 5.0000 mg | ORAL_TABLET | ORAL | 1 refills | Status: DC | PRN

## 2022-03-30 MED ORDER — FLUTICASONE PROPIONATE 50 MCG/ACT NA SUSP: 1.0000 | Freq: Every day | NASAL | 0 refills | Status: DC

## 2022-03-30 NOTE — Progress Notes (Signed)
Acute Office Visit  Subjective:     Patient ID: Anna Poole, female    DOB: 02/08/1971, 52 y.o.   MRN: [id number]  Chief Complaint  Patient presents with   Cough    Pt reports sx of dry cough, postal drainage, headache, sinus pressure, fatigue and ear clogged. Denied fever, chills, bodyache. Sx started on 12/22. Took mucinex. Sx subsided then came back .     HPI Patient is a 52 year old female with PMH SIG s that for anxiety, migraines, HTN, HLD who is followed by Dr. Regis Bill in today for ongoing concern.  Patient endorses cough, congestion, ears feeling clogged, burning in sinuses, and gingival pruritus off-and-on x 3 weeks.  Symptoms nearly improve then return.  Patient tried Mucinex.  Patient requesting refill on Zomig.  States current symptoms causing migraines that are worse than the overall symptoms.  Previously seen by headache clinic in [place], which unexpectedly closed in spring 2023.    Review of Systems  Constitutional:  Positive for malaise/fatigue.  HENT:  Positive for congestion and sinus pain.        Ears feeling clogged  Respiratory:  Positive for cough.   Neurological:  Positive for headaches.        Objective:    BP (!) 128/90 (BP Location: Right Arm, Patient Position: Sitting, Cuff Size: Normal)   Pulse 73   Temp 98.5 F (36.9 C) (Oral)   Wt 130 lb 3.2 oz (59.1 kg)   SpO2 98%   BMI 22.35 kg/m    Physical Exam Constitutional:      Appearance: Normal appearance.  HENT:     Head: Normocephalic and atraumatic.     Ears:     Comments: TMs full bilaterally.  No erythema.  TTP of maxillary and frontal sinuses.    Nose: Congestion present.     Comments: Very narrow naris with erythema and edematous nasal turbinates.    Mouth/Throat:     Mouth: Mucous membranes are moist.     Pharynx: No oropharyngeal exudate or posterior oropharyngeal erythema.  Cardiovascular:     Rate and Rhythm: Normal rate and regular rhythm.     Heart sounds: Normal  heart sounds. No murmur heard.    No friction rub. No gallop.  Pulmonary:     Effort: Pulmonary effort is normal.     Breath sounds: Normal breath sounds. No wheezing, rhonchi or rales.  Neurological:     Mental Status: She is alert.     No results found for any visits on 03/30/22.      Assessment & Plan:   Problem List Items Addressed This Visit   None Visit Diagnoses     Subacute pansinusitis    -  Primary   Relevant Medications   amoxicillin-clavulanate (AUGMENTIN) 500-125 MG tablet   fluticasone (FLONASE) 50 MCG/ACT nasal spray   Other migraine without status migrainosus, not intractable       Relevant Medications   zolmitriptan (ZOMIG) 5 MG tablet       Meds ordered this encounter  Medications   amoxicillin-clavulanate (AUGMENTIN) 500-125 MG tablet    Sig: Take 1 tablet by mouth in the morning and at bedtime for 7 days.    Dispense:  14 tablet    Refill:  0   zolmitriptan (ZOMIG) 5 MG tablet    Sig: Take 1 tablet (5 mg total) by mouth as needed for migraine. Repeat dose in 2 hours if needed    Dispense:  10 tablet  Refill:  1   fluticasone (FLONASE) 50 MCG/ACT nasal spray    Sig: Place 1 spray into both nostrils daily.    Dispense:  16 g    Refill:  0  Start Augmentin for subacute sinusitis.  Flonase and saline nasal rinse.  Zomig refilled in the absence of patient's PCP.  Given precautions.  Return if symptoms worsen or fail to improve.  Billie Ruddy, MD

## 2022-03-30 NOTE — Patient Instructions (Signed)
A prescription for Augmentin, an antibiotic for sinus infection as well as Flonase was sent to your pharmacy.  If your insurance does not cover Flonase you can find over-the-counter at your local drugstore.  A refill on Slow-Mag was also sent to your pharmacy.

## 2022-04-21 LAB — HM MAMMOGRAPHY

## 2022-05-22 ENCOUNTER — Encounter: Payer: BC Managed Care – PPO | Admitting: Internal Medicine

## 2022-07-06 ENCOUNTER — Other Ambulatory Visit: Payer: Self-pay | Admitting: Internal Medicine

## 2022-08-21 NOTE — Progress Notes (Unsigned)
No chief complaint on file.   HPI: Patient  Anna Poole  52 y.o. comes in today for Preventive Health Care visit   Bp Hx of migraines HDL  Health Maintenance  Topic Date Due   HIV Screening  Never done   Hepatitis C Screening  Never done   Zoster Vaccines- Shingrix (1 of 2) Never done   Colonoscopy  Never done   MAMMOGRAM  12/24/2021   INFLUENZA VACCINE  10/19/2022   PAP SMEAR-Modifier  05/08/2024   DTaP/Tdap/Td (3 - Td or Tdap) 04/24/2029   HPV VACCINES  Aged Out   COVID-19 Vaccine  Discontinued   Health Maintenance Review LIFESTYLE:  Exercise:   Tobacco/ETS: Alcohol:  Sugar beverages: Sleep: Drug use: no HH of  Work:    ROS:  GEN/ HEENT: No fever, significant weight changes sweats headaches vision problems hearing changes, CV/ PULM; No chest pain shortness of breath cough, syncope,edema  change in exercise tolerance. GI /GU: No adominal pain, vomiting, change in bowel habits. No blood in the stool. No significant GU symptoms. SKIN/HEME: ,no acute skin rashes suspicious lesions or bleeding. No lymphadenopathy, nodules, masses.  NEURO/ PSYCH:  No neurologic signs such as weakness numbness. No depression anxiety. IMM/ Allergy: No unusual infections.  Allergy .   REST of 12 system review negative except as per HPI   Past Medical History:  Diagnosis Date   Anxiety    CARDIAC MURMUR 07/12/2007   Qualifier: Diagnosis of  By: Lawernce Ion, CMA (AAMA), Bethann Berkshire    Headache(784.0)    Heart murmur    had neg echo and ekg   History of miscarriage 2009   Hx of syncope    summer poss heat and hydration   Hyperkalemia 07/08/2010   Hypertension    Hosp 11/09 and K 3.2 neg eval, nl echo and neg aldopra ratio   HYPOKALEMIA, HX OF 02/18/2008   Qualifier: Diagnosis of  By: Fabian Sharp MD, Neta Mends    IRREGULAR MENSES 09/06/2007   Qualifier: Diagnosis of  By: Lawernce Ion, CMA (AAMA), Shannon S    Palpitations    Panic attack 06/13/2011   Preeclampsia    ht in pregnancy     Premenopausal patient 09/06/2017   see report    Past Surgical History:  Procedure Laterality Date   OVARIAN CYST REMOVAL  1991   [facility]    Family History  Problem Relation Age of Onset   Hypertension Mother    Diabetes type II Unknown        ggf   Stroke Unknown        pgf     Social History   Socioeconomic History   Marital status: Married    Spouse name: Not on file   Number of children: Not on file   Years of education: Not on file   Highest education level: Not on file  Occupational History   Not on file  Tobacco Use   Smoking status: Never   Smokeless tobacco: Never  Substance and Sexual Activity   Alcohol use: Yes   Drug use: No   Sexual activity: Not on file  Other Topics Concern   Not on file  Social History Narrative   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     fluticasone (FLONASE) 50 MCG/ACT nasal spray Place 1 spray into both nostrils daily. 16 g 0   lisinopril (ZESTRIL) 10 MG tablet TAKE 1/2 TO 1 TABLET BY MOUTH  DAILY 90 tablet 0   LORazepam (ATIVAN) 0.5 MG tablet TAKE 1 OR 2 TABLETS BY MOUTH EVERY 8 HOURS AS NEEDED FOR ANXIETY 20 tablet 0   Rimegepant Sulfate (NURTEC) 75 MG TBDP Take by mouth.     zolmitriptan (ZOMIG) 5 MG tablet Take 1 tablet (5 mg total) by mouth as needed for migraine. Repeat dose in 2 hours if needed 10 tablet 1   No facility-administered  medications prior to visit.     EXAM:  There were no vitals taken for this visit.  There is no height or weight on file to calculate BMI. Wt Readings from Last 3 Encounters:  03/30/22 130 lb 3.2 oz (59.1 kg)  09/13/20 120 lb 6.4 oz (54.6 kg)  04/25/19 126 lb 4 oz (57.3 kg)    Physical Exam: Vital signs reviewed ZOX:WRUE is a well-developed well-nourished alert cooperative    who appearsr stated age in no acute distress.  HEENT: normocephalic atraumatic , Eyes: PERRL EOM's full, conjunctiva clear, Nares: paten,t no deformity discharge or tenderness., Ears: no deformity EAC's clear TMs with normal landmarks. Mouth: clear OP, no lesions, edema.  Moist mucous membranes. Dentition in adequate repair. NECK: supple without masses, thyromegaly or bruits. CHEST/PULM:  Clear to auscultation and percussion breath sounds equal no wheeze , rales or rhonchi. No chest wall deformities or tenderness. Breast: normal by inspection . No dimpling, discharge, masses, tenderness or discharge . CV: PMI is nondisplaced, S1 S2 no gallops, murmurs, rubs. Peripheral pulses are full without delay.No JVD .  ABDOMEN: Bowel sounds normal nontender  No guard or rebound, no hepato splenomegal no CVA tenderness.  No hernia. Extremtities:  No clubbing cyanosis or edema, no acute joint swelling or redness no focal atrophy NEURO:  Oriented x3, cranial nerves 3-12 appear to be intact, no obvious focal weakness,gait within normal limits no abnormal reflexes or asymmetrical SKIN: No acute rashes normal turgor, color, no bruising or petechiae. PSYCH: Oriented, good eye contact, no obvious depression anxiety, cognition and judgment appear normal. LN: no cervical axillary inguinal adenopathy  Lab Results  Component Value Date   WBC 4.6 09/13/2020   HGB 12.9 09/13/2020   HCT 37.6 09/13/2020   PLT 234.0 09/13/2020   GLUCOSE 81 09/13/2020   CHOL 212 (H) 09/13/2020   TRIG 90.0 09/13/2020   HDL 79.60 09/13/2020   LDLDIRECT  166.2 03/07/2013   LDLCALC 115 (H) 09/13/2020   ALT 14 09/13/2020   AST 18 09/13/2020   NA 140 09/13/2020   K 4.1 09/13/2020   CL 103 09/13/2020   CREATININE 0.77 09/13/2020   BUN 15 09/13/2020   CO2 28 09/13/2020   TSH 1.45 09/13/2020   INR 1.1 02/12/2008   HGBA1C  02/12/2008    5.2 (NOTE)   The ADA recommends the following therapeutic goal for  glycemic   control related to Hgb A1C measurement:   Goal of Therapy:   < 7.0% Hgb A1C   Reference: American Diabetes Association: Clinical Practice   Recommendations 2008, Diabetes Care,  2008, 31:(Suppl 1).    BP Readings from Last 3 Encounters:  03/30/22 (!) 128/90  09/13/20 124/80  04/25/19 124/72    Labplan reviewed with patient   ASSESSMENT AND PLAN:  Discussed the following assessment and plan:    ICD-10-CM   1. Essential hypertension  I10     2. Visit for preventive health examination  Z00.00     3. Mixed hyperlipidemia  E78.2     4. Medication management  Z79.899      No follow-ups on file.  Patient Care Team: Madelin Headings, MD as PCP - General Huel Cote, MD (Obstetrics and Gynecology) Annia Belt, MD as Referring Physician (Psychiatry) There are no Patient Instructions on file for this visit.  Neta Mends. Lometa Riggin M.D.

## 2022-08-22 ENCOUNTER — Encounter: Payer: Self-pay | Admitting: Internal Medicine

## 2022-08-22 ENCOUNTER — Ambulatory Visit (INDEPENDENT_AMBULATORY_CARE_PROVIDER_SITE_OTHER): Payer: BC Managed Care – PPO | Admitting: Internal Medicine

## 2022-08-22 VITALS — BP 114/84 | HR 93 | Temp 98.4°F | Ht 64.0 in | Wt 127.2 lb

## 2022-08-22 DIAGNOSIS — E782 Mixed hyperlipidemia: Secondary | ICD-10-CM

## 2022-08-22 DIAGNOSIS — Z0000 Encounter for general adult medical examination without abnormal findings: Secondary | ICD-10-CM

## 2022-08-22 DIAGNOSIS — G43809 Other migraine, not intractable, without status migrainosus: Secondary | ICD-10-CM | POA: Diagnosis not present

## 2022-08-22 DIAGNOSIS — Z79899 Other long term (current) drug therapy: Secondary | ICD-10-CM

## 2022-08-22 DIAGNOSIS — I10 Essential (primary) hypertension: Secondary | ICD-10-CM | POA: Diagnosis not present

## 2022-08-22 DIAGNOSIS — Z23 Encounter for immunization: Secondary | ICD-10-CM

## 2022-08-22 LAB — CBC WITH DIFFERENTIAL/PLATELET
Basophils Absolute: 0 10*3/uL (ref 0.0–0.1)
Basophils Relative: 0.6 % (ref 0.0–3.0)
Eosinophils Absolute: 0.1 10*3/uL (ref 0.0–0.7)
Eosinophils Relative: 1 % (ref 0.0–5.0)
HCT: 43.2 % (ref 36.0–46.0)
Hemoglobin: 14.1 g/dL (ref 12.0–15.0)
Lymphocytes Relative: 35 % (ref 12.0–46.0)
Lymphs Abs: 1.8 10*3/uL (ref 0.7–4.0)
MCHC: 32.7 g/dL (ref 30.0–36.0)
MCV: 90.1 fl (ref 78.0–100.0)
Monocytes Absolute: 0.4 10*3/uL (ref 0.1–1.0)
Monocytes Relative: 8.2 % (ref 3.0–12.0)
Neutro Abs: 2.8 10*3/uL (ref 1.4–7.7)
Neutrophils Relative %: 55.2 % (ref 43.0–77.0)
Platelets: 248 10*3/uL (ref 150.0–400.0)
RBC: 4.8 Mil/uL (ref 3.87–5.11)
RDW: 13 % (ref 11.5–15.5)
WBC: 5.1 10*3/uL (ref 4.0–10.5)

## 2022-08-22 LAB — LIPID PANEL
Cholesterol: 224 mg/dL — ABNORMAL HIGH (ref 0–200)
HDL: 67.2 mg/dL (ref >39.00)
LDL Cholesterol: 137 mg/dL — ABNORMAL HIGH (ref 0–99)
NonHDL: 156.43
Total CHOL/HDL Ratio: 3
Triglycerides: 95 mg/dL (ref 0.0–149.0)
VLDL: 19 mg/dL (ref 0.0–40.0)

## 2022-08-22 LAB — HEPATIC FUNCTION PANEL
ALT: 12 U/L (ref 0–35)
AST: 18 U/L (ref 0–37)
Albumin: 4.7 g/dL (ref 3.5–5.2)
Alkaline Phosphatase: 41 U/L (ref 39–117)
Bilirubin, Direct: 0.1 mg/dL (ref 0.0–0.3)
Total Bilirubin: 0.5 mg/dL (ref 0.2–1.2)
Total Protein: 7.9 g/dL (ref 6.0–8.3)

## 2022-08-22 LAB — BASIC METABOLIC PANEL
BUN: 14 mg/dL (ref 6–23)
CO2: 26 mEq/L (ref 19–32)
Calcium: 9.9 mg/dL (ref 8.4–10.5)
Chloride: 105 mEq/L (ref 96–112)
Creatinine, Ser: 0.79 mg/dL (ref 0.40–1.20)
GFR: 86.51 mL/min (ref >60.00)
Glucose, Bld: 78 mg/dL (ref 70–99)
Potassium: 4 mEq/L (ref 3.5–5.1)
Sodium: 140 mEq/L (ref 135–145)

## 2022-08-22 LAB — TSH: TSH: 1.92 u[IU]/mL (ref 0.35–5.50)

## 2022-08-22 MED ORDER — LISINOPRIL 10 MG PO TABS: 10.0000 mg | ORAL_TABLET | Freq: Every day | ORAL | 3 refills | Status: DC

## 2022-08-22 MED ORDER — ZOLMITRIPTAN 5 MG PO TABS: 5.0000 mg | ORAL_TABLET | ORAL | 1 refills | Status: DC | PRN

## 2022-08-22 MED ORDER — ESCITALOPRAM OXALATE 10 MG PO TABS: 10.0000 mg | ORAL_TABLET | Freq: Every day | ORAL | 3 refills | Status: DC

## 2022-08-22 NOTE — Patient Instructions (Signed)
Good to see you today . Meds refilled.today  Updated labs  If all ok the yearly check with labs . Second shingrix 2-6 months

## 2022-09-05 NOTE — Progress Notes (Signed)
Cholesterol up some me  all other stable or  in range  Continue lifestyle intervention healthy eating and exercise .  Yearly check advised

## 2022-09-15 ENCOUNTER — Encounter: Payer: Self-pay | Admitting: Internal Medicine

## 2022-12-22 ENCOUNTER — Other Ambulatory Visit: Payer: Self-pay | Admitting: Internal Medicine

## 2022-12-22 DIAGNOSIS — Z1212 Encounter for screening for malignant neoplasm of rectum: Secondary | ICD-10-CM

## 2022-12-22 DIAGNOSIS — Z1211 Encounter for screening for malignant neoplasm of colon: Secondary | ICD-10-CM

## 2022-12-27 ENCOUNTER — Other Ambulatory Visit: Payer: Self-pay | Admitting: Internal Medicine

## 2022-12-27 ENCOUNTER — Encounter: Payer: Self-pay | Admitting: Internal Medicine

## 2022-12-27 DIAGNOSIS — R519 Headache, unspecified: Secondary | ICD-10-CM

## 2022-12-27 DIAGNOSIS — G43809 Other migraine, not intractable, without status migrainosus: Secondary | ICD-10-CM

## 2023-01-05 ENCOUNTER — Ambulatory Visit (INDEPENDENT_AMBULATORY_CARE_PROVIDER_SITE_OTHER): Payer: BC Managed Care – PPO

## 2023-01-05 DIAGNOSIS — Z23 Encounter for immunization: Secondary | ICD-10-CM | POA: Diagnosis not present

## 2023-04-06 ENCOUNTER — Other Ambulatory Visit: Payer: Self-pay | Admitting: Internal Medicine

## 2023-04-06 DIAGNOSIS — G43809 Other migraine, not intractable, without status migrainosus: Secondary | ICD-10-CM

## 2023-05-07 ENCOUNTER — Encounter: Payer: Self-pay | Admitting: Neurology

## 2023-05-07 ENCOUNTER — Ambulatory Visit (INDEPENDENT_AMBULATORY_CARE_PROVIDER_SITE_OTHER): Payer: BC Managed Care – PPO | Admitting: Neurology

## 2023-05-07 VITALS — BP 121/84 | HR 89 | Ht 63.0 in | Wt 131.0 lb

## 2023-05-07 DIAGNOSIS — G43009 Migraine without aura, not intractable, without status migrainosus: Secondary | ICD-10-CM | POA: Diagnosis not present

## 2023-05-07 MED ORDER — NURTEC 75 MG PO TBDP: 75.0000 mg | ORAL_TABLET | Freq: Every day | ORAL | 11 refills | Status: DC | PRN

## 2023-05-07 MED ORDER — QULIPTA 60 MG PO TABS: 60.0000 mg | ORAL_TABLET | Freq: Every day | ORAL | 11 refills | Status: AC

## 2023-05-07 MED ORDER — TOPIRAMATE 50 MG PO TABS
50.0000 mg | ORAL_TABLET | Freq: Every evening | ORAL | 6 refills | Status: AC
Start: 2023-05-07 — End: ?

## 2023-05-07 NOTE — Patient Instructions (Addendum)
 As needed: nurtec take in advance if needed may take with ibuprofen or ondansetron  Prevention: Start Qulipta   Other options include: Ajovy, Emgality, Vyepti  CANNOT QUALIFY FOR AJOVY/EMGALITY/ , BOTOX at this point because does not have > 8 migraine days a month or > 15 total headache days a month  Atogepant Tablets What is this medication? ATOGEPANT (a TOE je pant) prevents migraines. It works by blocking a substance in the body that causes migraines. This medicine may be used for other purposes; ask your health care provider or pharmacist if you have questions. COMMON BRAND NAME(S): QULIPTA What should I tell my care team before I take this medication? They need to know if you have any of these conditions: Kidney disease Liver disease An unusual or allergic reaction to atogepant, other medications, foods, dyes, or preservatives Pregnant or trying to get pregnant Breast-feeding How should I use this medication? Take this medication by mouth with water. Take it as directed on the prescription label at the same time every day. You can take it with or without food. If it upsets your stomach, take it with food. Keep taking it unless your care team tells you to stop. Talk to your care team about the use of this medication in children. Special care may be needed. Overdosage: If you think you have taken too much of this medicine contact a poison control center or emergency room at once. NOTE: This medicine is only for you. Do not share this medicine with others. What if I miss a dose? If you miss a dose, take it as soon as you can. If it is almost time for your next dose, take only that dose. Do not take double or extra doses. What may interact with this medication? Carbamazepine Certain medications for fungal infections, such as itraconazole, ketoconazole Clarithromycin Cyclosporine Efavirenz Etravirine Phenytoin Rifampin St. John's wort This list may not describe all possible  interactions. Give your health care provider a list of all the medicines, herbs, non-prescription drugs, or dietary supplements you use. Also tell them if you smoke, drink alcohol, or use illegal drugs. Some items may interact with your medicine. What should I watch for while using this medication? Visit your care team for regular checks on your progress. Tell your care team if your symptoms do not start to get better or if they get worse. What side effects may I notice from receiving this medication? Side effects that you should report to your care team as soon as possible: Allergic reactions--skin rash, itching, hives, swelling of the face, lips, tongue, or throat Side effects that usually do not require medical attention (report to your care team if they continue or are bothersome): Constipation Fatigue Loss of appetite with weight loss Nausea This list may not describe all possible side effects. Call your doctor for medical advice about side effects. You may report side effects to FDA at 1-800-FDA-1088. Where should I keep my medication? Keep out of the reach of children and pets. Store at room temperature between 20 and 25 degrees C (68 and 77 degrees F). Get rid of any unused medication after the expiration date. To get rid of medications that are no longer needed or have expired: Take the medication to a medication take-back program. Check with your pharmacy or law enforcement to find a location. If you cannot return the medication, check the label or package insert to see if the medication should be thrown out in the garbage or flushed down the toilet.  If you are not sure, ask your care team. If it is safe to put it in the trash, take the medication out of the container. Mix the medication with cat litter, dirt, coffee grounds, or other unwanted substance. Seal the mixture in a bag or container. Put it in the trash. NOTE: This sheet is a summary. It may not cover all possible information.  If you have questions about this medicine, talk to your doctor, pharmacist, or health care provider.  2024 Elsevier/Gold Standard (2021-04-25 00:00:00)Rimegepant Disintegrating Tablets What is this medication? RIMEGEPANT (ri ME je pant) prevents and treats migraines. It works by blocking a substance in the body that causes migraines. This medicine may be used for other purposes; ask your health care provider or pharmacist if you have questions. COMMON BRAND NAME(S): NURTEC ODT What should I tell my care team before I take this medication? They need to know if you have any of these conditions: Kidney disease Liver disease An unusual or allergic reaction to rimegepant, other medications, foods, dyes, or preservatives Pregnant or trying to get pregnant Breast-feeding How should I use this medication? Take this medication by mouth. Take it as directed on the prescription label. Leave the tablet in the sealed pack until you are ready to take it. With dry hands, open the pack and gently remove the tablet. If the tablet breaks or crumbles, throw it away. Use a new tablet. Place the tablet in the mouth and allow it to dissolve. Then, swallow it. Do not cut, crush, or chew this medication. You do not need water to take this medication. Talk to your care team about the use of this medication in children. Special care may be needed. Overdosage: If you think you have taken too much of this medicine contact a poison control center or emergency room at once. NOTE: This medicine is only for you. Do not share this medicine with others. What if I miss a dose? This does not apply. This medication is not for regular use. What may interact with this medication? Certain medications for fungal infections, such as fluconazole, itraconazole Rifampin This list may not describe all possible interactions. Give your health care provider a list of all the medicines, herbs, non-prescription drugs, or dietary supplements  you use. Also tell them if you smoke, drink alcohol, or use illegal drugs. Some items may interact with your medicine. What should I watch for while using this medication? Visit your care team for regular checks on your progress. Tell your care team if your symptoms do not start to get better or if they get worse. What side effects may I notice from receiving this medication? Side effects that you should report to your care team as soon as possible: Allergic reactions--skin rash, itching, hives, swelling of the face, lips, tongue, or throat Side effects that usually do not require medical attention (report to your care team if they continue or are bothersome): Nausea Stomach pain This list may not describe all possible side effects. Call your doctor for medical advice about side effects. You may report side effects to FDA at 1-800-FDA-1088. Where should I keep my medication? Keep out of the reach of children and pets. Store at room temperature between 20 and 25 degrees C (68 and 77 degrees F). Get rid of any unused medication after the expiration date. To get rid of medications that are no longer needed or have expired: Take the medication to a medication take-back program. Check with your pharmacy or law enforcement  to find a location. If you cannot return the medication, check the label or package insert to see if the medication should be thrown out in the garbage or flushed down the toilet. If you are not sure, ask your care team. If it is safe to put it in the trash, take the medication out of the container. Mix the medication with cat litter, dirt, coffee grounds, or other unwanted substance. Seal the mixture in a bag or container. Put it in the trash. NOTE: This sheet is a summary. It may not cover all possible information. If you have questions about this medicine, talk to your doctor, pharmacist, or health care provider.  2024 Elsevier/Gold Standard (2021-04-27 00:00:00)Ondansetron  Dissolving Tablets What is this medication? ONDANSETRON (on DAN se tron) prevents nausea and vomiting from chemotherapy, radiation, or surgery. It works by blocking substances in the body that may cause nausea or vomiting. It belongs to a group of medications called antiemetics. This medicine may be used for other purposes; ask your health care provider or pharmacist if you have questions. COMMON BRAND NAME(S): Zofran ODT What should I tell my care team before I take this medication? They need to know if you have any of these conditions: Heart disease Irregular heartbeat or rhythm Liver disease Low levels of magnesium or potassium in the blood An unusual or allergic reaction to ondansetron, other medications, foods, dyes, or preservatives Pregnant or trying to get pregnant Breastfeeding How should I use this medication? Take this medication by mouth. Take it as directed on the prescription label at the same time every day. You do not need water to take this medication. Leave the tablet in the sealed pack until you are ready to take it. With dry hands, open the pack and gently remove the tablet. Place the tablet in the mouth and allow it to dissolve. Then, swallow it. Talk to your care team about the use of this medication in children. Special care may be needed. Overdosage: If you think you have taken too much of this medicine contact a poison control center or emergency room at once. NOTE: This medicine is only for you. Do not share this medicine with others. What if I miss a dose? If you miss a dose, take it as soon as you can. If it is almost time for your next dose, take only that dose. Do not take double or extra doses. What may interact with this medication? Do not take this medication with any of the following: Apomorphine Certain medications for fungal infections, such as fluconazole, ketoconazole,  posaconazole Cisapride Dronedarone Levoketoconazole Pimozide Quinidine Thioridazine This medication may also interact with the following: Certain medications for depression, anxiety, or other mental health conditions Certain medications for migraines, such as sumatriptan Linezolid Methylene blue Opioids Other medications that cause heart rhythm changes, such as dofetilide or ziprasidone St. John's wort Stimulant medications for ADHD, weight loss, or staying awake Tryptophan This list may not describe all possible interactions. Give your health care provider a list of all the medicines, herbs, non-prescription drugs, or dietary supplements you use. Also tell them if you smoke, drink alcohol, or use illegal drugs. Some items may interact with your medicine. What should I watch for while using this medication? Check with your care team as soon as you can if you have any sign of an allergic reaction. What side effects may I notice from receiving this medication? Side effects that you should report to your care team as soon as possible: Allergic  reactions--skin rash, itching, hives, swelling of the face, lips, tongue, or throat Bowel blockage--stomach cramping, unable to have a bowel movement or pass gas, loss of appetite, vomiting Chest pain (angina)--pain, pressure, or tightness in the chest, neck, back, or arms Heart rhythm changes--fast or irregular heartbeat, dizziness, feeling faint or lightheaded, chest pain, trouble breathing Irritability, confusion, fast or irregular heartbeat, muscle stiffness, twitching muscles, sweating, high fever, seizure, chills, vomiting, diarrhea, which may be signs of serotonin syndrome Side effects that usually do not require medical attention (report to your care team if they continue or are bothersome): Constipation Diarrhea General discomfort and fatigue Headache This list may not describe all possible side effects. Call your doctor for medical advice  about side effects. You may report side effects to FDA at 1-800-FDA-1088. Where should I keep my medication? Keep out of the reach of children and pets. Store between 2 and 30 degrees C (36 and 86 degrees F). Throw away any unused medication after the expiration date. NOTE: This sheet is a summary. It may not cover all possible information. If you have questions about this medicine, talk to your doctor, pharmacist, or health care provider.  2024 Elsevier/Gold Standard (2022-05-10 00:00:00)

## 2023-05-07 NOTE — Progress Notes (Signed)
 [facility]    Provider:  Dr Lucia Gaskins Requesting Provider: Eulis Foster, FNP Primary Care Provider:  Madelin Headings, MD  CC:  migraines  HPI:  Anna Poole is a 53 y.o. female here as requested by Anna Foster, FNP for migraines. has Anxiety state; Essential hypertension; RHINITIS; Premenstrual tension syndrome; SLEEPLESSNESS; Headache; HYPOKALEMIA, HX OF; Preventative health care; Medication adverse effect; Hx of syncope; chest pain nocturnal poss HB; Elevated cholesterol; Visit for preventive health examination; Recurrent headache; Essential hypertension; Hyperlipidemia; and Migraine without aura and without status migrainosus, not intractable on their problem list.  .   This is a patient who was seen [facility] in the past, I reviewed Anna Poole's Anna Poole note from 2022, at that time she was on Zometa, and Nurtec, she had a diagnosis of migraine without aura without status migrainosus not intractable, she was asked to follow-up in June 2023 does not appear as though she did, her Anna Poole, she has chronic migraines, started when she was young, pain is located in the left unilateral retro-orbital region, radiates to the face, pulsating pounding sharp squeezing and stabbing, associated dizziness eye pain eye watering nausea neck pain, no fever, hearing loss, numbness, photophobia or autonomic symptoms. Per Anna Poole's notes, see below of other medications that patient has tried.  Today she is here and she describes migraines since she was young, Zomig and Nurtec have helped, no aura, no medication overuse, she has had migraines for years, started when she was young, the migraines are pulsating pounding throbbing, they are usually behind the left eye retro-orbital unilateral, can radiate into the face and the back of the head, she has photophobia and phonophobia, nausea, no vomiting, migraines can be moderate to severe, they can last upwards of 24 hours, she has 10 total  headache days out of the month, 6 of those are moderate to severe migraines, it hurts to move, she has a lot of neck pain associated with her migraines.   Had them since a kid, mother has migraines. Nurtec helped a lot and didn't make her tired. She was on it for a year. A lot of her migraines were menstrual and not as bad. It can start in the neck left side and shoot forward to the eye like someone is taking a needle and shoving into the pupil. She feels eye pain on the left, no vision changes. No new symptoms, no changes in quality and severity,.zomig makes her sleepy  Reviewed notes, labs and imaging from outside physicians, which showed:  Current and past medications: ANALGESICS:excedrin, fioricet, percocet, tylenol ANTI-MIGRAINE:frova, imitrex tab, maxalt, relpax, Nurtec HEART/BP: lisinopril, metoprolol DECONGESTANT/ANTIHISTAMINE:flonase, sudafed ANTI-NAUSEANT:zofran NSAIDS:naproxen MUSCLE RELAXANTS: flexeril ANTI-CONVULSANTS: Topiramate STEROIDS: prednisone SLEEPING PILLS/TRANQUILIZERS:ativan, melatonin, tylenol pm ANTI-DEPRESSANTS:lexapro, amitriptyline, zoloft HERBAL:  FIBROMYALGIA:  HORMONAL: OTHER: Nurtec PROCEDURES FOR HEADACHES:   CANNOT QUALIFY FOR AJOVY/EMGALITY/ AJOVY, BOTOX at this point because does not have > 8 migraine days a month or > 15 total headache days a month, aimovig contraindicated due to constipation  09/11/2022: tsh,cbc,cmp nml   Review of Systems: Patient complains of symptoms per HPI as well as the following symptoms none. Pertinent negatives and positives per HPI. All others negative.   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   Married   Regular exercise- no   3 kids at home  1 dog    HH of 5   Child x 3    Sleep 7-8 hours   G4P3      Social Drivers of Health   Financial Resource Strain: Low Risk  (08/22/2022)   Overall Financial Resource Strain (CARDIA)    Difficulty of Paying Living Expenses: Not hard at all  Food Insecurity: No Food Insecurity (08/22/2022)   Hunger Vital Sign    Worried About Running Out of Food in the Last Year: Never true    Ran Out of Food in the Last Year: Never true  Transportation Needs: No Transportation Needs (08/22/2022)   PRAPARE - Administrator, Civil Service (Medical): No    Lack of Transportation (Non-Medical): No  Physical Activity: Sufficiently Active (08/22/2022)   Exercise Vital Sign    Days of Exercise per Week: 5 days    Minutes of Exercise per Session: 40 min  Stress: Stress Concern Present (08/22/2022)   [employer] of Occupational Health - Occupational Stress Questionnaire    Feeling of Stress : Rather much  Social Connections: Socially Integrated (08/22/2022)   Social Connection and Isolation Panel [NHANES]    Frequency of Communication with Friends and Family: More than three times a week    Frequency of Social Gatherings with Friends and Family: Once a week    Attends Religious Services: More than 4 times per year    Active Member of [employer] or Organizations: Yes    Attends Banker Meetings: More than 4 times per year    Marital Status: Married  Catering manager Violence: Not At Risk (08/22/2022)   Humiliation, Afraid, Rape, and Kick questionnaire    Fear of Current or Ex-Partner: No    Emotionally Abused: No    Physically Abused: No    Sexually Abused: No    Family History  Problem Relation Age of Onset   Hypertension Mother    Diabetes type II Unknown        ggf   Stroke  Unknown        pgf     Past Medical History:  Diagnosis Date   Anxiety    CARDIAC MURMUR 07/12/2007   Qualifier: Diagnosis of  By: Lawernce Ion, CMA (AAMA), Bethann Berkshire    Headache(784.0)    Heart murmur    had neg echo and ekg   History of miscarriage 2009   Hx of syncope    summer poss heat and hydration   Hyperkalemia 07/08/2010   Hypertension    Hosp 11/09 and K 3.2 neg eval, nl echo and neg aldopra ratio   HYPOKALEMIA, HX OF 02/18/2008   Qualifier: Diagnosis of  By: Fabian Sharp MD, Neta Mends    IRREGULAR MENSES 09/06/2007   Qualifier: Diagnosis of  By: Lawernce Ion, CMA (AAMA), Shannon S    Palpitations    Panic attack 06/13/2011   Preeclampsia    ht in pregnancy    Premenopausal patient 09/06/2017   see report    Patient Active Problem List   Diagnosis Date Noted   Migraine without aura and  without status migrainosus, not intractable 05/07/2023   Recurrent headache 03/28/2014   Essential hypertension 03/28/2014   Hyperlipidemia 03/28/2014   Elevated cholesterol 03/18/2013   Visit for preventive health examination 03/18/2013   chest pain nocturnal poss HB 02/10/2012   Hx of syncope    Medication adverse effect 01/28/2011   Preventative health care 07/08/2010   Anxiety state 07/16/2009   RHINITIS 05/08/2008   Essential hypertension 02/18/2008   HYPOKALEMIA, HX OF 02/18/2008   Premenstrual tension syndrome 07/12/2007   SLEEPLESSNESS 07/12/2007   Headache 07/12/2007    Past Surgical History:  Procedure Laterality Date   OVARIAN CYST REMOVAL  1991   [facility]    Current Outpatient Medications  Medication Sig Dispense Refill   Atogepant (QULIPTA) 60 MG TABS Take 1 tablet (60 mg total) by mouth daily. PLEASE USE COPAY CARD: BIN [id number] PCN CNRX GRP [id number] ID [id number] 30 tablet 11   escitalopram (LEXAPRO) 10 MG tablet Take 1 tablet (10 mg total) by mouth daily. 90 tablet 3   estradiol-norethindrone (ACTIVELLA) 1-0.5 MG tablet Take 1 tablet by mouth daily.     lisinopril  (ZESTRIL) 10 MG tablet Take 1 tablet (10 mg total) by mouth daily. 90 tablet 3   Rimegepant Sulfate (NURTEC) 75 MG TBDP Take 1 tablet (75 mg total) by mouth daily as needed. For migraines. Take as close to onset of migraine as possible. One daily maximum. PLEASE USE COPAY CARD: BIN [id number]  PCN# CN   GRP# [id number]  ID#  [id number] 16 tablet 11   topiramate (TOPAMAX) 50 MG tablet Take 1 tablet (50 mg total) by mouth at bedtime. 30 tablet 6   zolmitriptan (ZOMIG) 5 MG tablet TAKE 1 TABLET BY MOUTH AS NEEDED FOR MIGRAINE, REPEAT DOSE IF NEEDED IN 2 HOURS 10 tablet 1   No current facility-administered medications for this visit.    Allergies as of 05/07/2023 - Review Complete 05/07/2023  Allergen Reaction Noted   Doxycycline Nausea And Vomiting 03/30/2022    Vitals: BP 121/84 (BP Location: Right Arm, Patient Position: Sitting, Cuff Size: Small)   Pulse 89   Ht 5\' 3"  (1.6 m)   Wt 131 lb (59.4 kg)   BMI 23.21 kg/m  Last Weight:  Wt Readings from Last 1 Encounters:  05/07/23 131 lb (59.4 kg)   Last Height:   Ht Readings from Last 1 Encounters:  05/07/23 5\' 3"  (1.6 m)     Physical exam: Exam: Gen: NAD, conversant, well nourised, well groomed                     CV: RRR, no MRG. No Carotid Bruits. No peripheral edema, warm, nontender Eyes: Conjunctivae clear without exudates or hemorrhage  Neuro: Detailed Neurologic Exam  Speech:    Speech is normal; fluent and spontaneous with normal comprehension.  Cognition:    The patient is oriented to person, place, and time;     recent and remote memory intact;     language fluent;     normal attention, concentration,     fund of knowledge Cranial Nerves:    The pupils are equal, round, and reactive to light. The fundi are normal and spontaneous venous pulsations are present. Visual fields are full to finger confrontation. Extraocular movements are intact. Trigeminal sensation is intact and the muscles of mastication are normal. The  face is symmetric. The palate elevates in the midline. Hearing intact. Voice is normal. Shoulder shrug is normal. The tongue has normal motion without fasciculations.  Coordination:    Normal finger to nose and heel to shin. Normal rapid alternating movements.   Gait:    Heel-toe and tandem gait are normal.   Motor Observation:    No asymmetry, no atrophy, and no involuntary movements noted. Tone:    Normal muscle tone.    Posture:    Posture is normal. normal erect    Strength:    Strength is V/V in the upper and lower limbs.      Sensation: intact to LT     Reflex Exam:  DTR's:    Deep tendon reflexes in the upper and lower extremities are normal bilaterally.   Toes:    The toes are downgoing bilaterally.   Clonus:    Clonus is absent.    Assessment/Plan:  Patient with migraines  As needed: nurtec take in advance if needed may take with ibuprofen or ondansetron  Prevention: Start Qulipta   Other options include: Ajovy, Emgality, Vyepti  CANNOT QUALIFY FOR AJOVY/EMGALITY/ , BOTOX at this point because does not have > 8 migraine days a month or > 15 total headache days a month   Meds ordered this encounter  Medications   Rimegepant Sulfate (NURTEC) 75 MG TBDP    Sig: Take 1 tablet (75 mg total) by mouth daily as needed. For migraines. Take as close to onset of migraine as possible. One daily maximum. PLEASE USE COPAY CARD: BIN [id number]  PCN# CN   GRP# [id number]  ID#  [id number]    Dispense:  16 tablet    Refill:  11    PLEASE USE COPAY CARD: BIN [id number]  PCN# CN   GRP# [id number]  ID#  [id number]   Atogepant (QULIPTA) 60 MG TABS    Sig: Take 1 tablet (60 mg total) by mouth daily. PLEASE USE COPAY CARD: BIN [id number] PCN CNRX GRP [id number] ID [id number]    Dispense:  30 tablet    Refill:  11    PLEASE USE COPAY CARD: BIN [id number] PCN CNRX GRP [id number] ID [id number]   topiramate (TOPAMAX) 50 MG tablet    Sig: Take 1 tablet (50 mg total) by mouth at bedtime.     Dispense:  30 tablet    Refill:  6    Cc: Anna Foster, FNP,  Panosh, Neta Mends, MD  Naomie Dean, MD  [facility] [address] [place], [place] [address]  Phone [phone] Fax [phone]   I spent over 45 minutes of face-to-face and non-face-to-face time with patient on the  1. Migraine without aura and without status migrainosus, not intractable    diagnosis.  This included previsit chart review, lab review, study review, order entry, electronic health record documentation, patient education on the different diagnostic and therapeutic options, counseling and coordination of care, risks and benefits of management, compliance, or risk factor reduction

## 2023-05-09 ENCOUNTER — Telehealth: Payer: Self-pay | Admitting: Pharmacist

## 2023-05-09 NOTE — Telephone Encounter (Signed)
 Pharmacy Patient Advocate Encounter   Received notification from Patient Pharmacy that prior authorization for Qulipta 60mg  is required/requested.   Insurance verification completed.   The patient is insured through [facility] .   Per test claim: PA required; PA submitted to above mentioned insurance via Prompt PA Key/confirmation #/EOC [id number] Status is pending

## 2023-05-15 NOTE — Telephone Encounter (Signed)
 Pharmacy Patient Advocate Encounter  Received notification from [facility] that Prior Authorization for Qulipta 60MG  tablets has been DENIED.  Full denial letter will be uploaded to the media tab. See denial reason below.   PA #/Case ID/Reference #: [id number]

## 2023-05-16 ENCOUNTER — Telehealth: Payer: Self-pay | Admitting: Pharmacist

## 2023-05-16 NOTE — Telephone Encounter (Signed)
 Appeal has been submitted for Qulipta. Will advise when response is received, please be advised that most companies may take 30 days to make a decision.  Appeal letter and supporting documentation have been faxed to [phone] on 05/16/2023 @3 :21 pm.  Thank you, Dellie Burns, PharmD Clinical Pharmacist  [place]  Direct Dial: [phone]

## 2023-05-21 ENCOUNTER — Telehealth: Payer: Self-pay | Admitting: Pharmacist

## 2023-05-21 NOTE — Telephone Encounter (Signed)
 Thanks!  I notified the patient.

## 2023-05-21 NOTE — Telephone Encounter (Signed)
 The appeal for Anna Poole has been approved:

## 2023-08-31 ENCOUNTER — Other Ambulatory Visit: Payer: Self-pay | Admitting: Internal Medicine

## 2023-11-14 ENCOUNTER — Telehealth: Payer: Self-pay | Admitting: Neurology

## 2023-11-14 NOTE — Telephone Encounter (Signed)
 LVM and sent mychart msg informing pt of appt change - MD schedule change

## 2023-11-26 ENCOUNTER — Telehealth: Payer: BC Managed Care – PPO | Admitting: Neurology

## 2023-12-10 ENCOUNTER — Other Ambulatory Visit (HOSPITAL_COMMUNITY): Payer: Self-pay

## 2023-12-11 ENCOUNTER — Other Ambulatory Visit (HOSPITAL_COMMUNITY): Payer: Self-pay

## 2023-12-11 ENCOUNTER — Telehealth: Payer: Self-pay

## 2023-12-11 DIAGNOSIS — G43009 Migraine without aura, not intractable, without status migrainosus: Secondary | ICD-10-CM

## 2023-12-11 NOTE — Telephone Encounter (Signed)
 PromptPA [employer] EOC:

## 2023-12-11 NOTE — Telephone Encounter (Signed)
 It is time to renew PA for Qulipta , However the patient has not been evaluated since starting qulipta  and insurance is looking for documentation on how the medication is or I snot working. Please see questions below. Thanks!

## 2023-12-12 ENCOUNTER — Other Ambulatory Visit (HOSPITAL_COMMUNITY): Payer: Self-pay

## 2023-12-12 MED ORDER — NURTEC 75 MG PO TBDP: 75.0000 mg | ORAL_TABLET | Freq: Every day | ORAL | 11 refills | Status: AC | PRN

## 2023-12-12 NOTE — Telephone Encounter (Signed)
 Pharmacy Patient Advocate Encounter   Received notification from CoverMyMeds that prior authorization for Qulipta  is required/requested.   Insurance verification completed.   The patient is insured through [facility] .   Per test claim: PA required; PA submitted to above mentioned insurance via Fax Key/confirmation #/EOC [id number] Status is pending

## 2023-12-12 NOTE — Telephone Encounter (Signed)
 Lvm 1st attempt bby hf 12/12/23

## 2023-12-12 NOTE — Telephone Encounter (Signed)
 Returned cal to pt and she was able to provider the answers to the questions  Received a message from our prior auth team and qulipta .  Needs a renewal PA.     Questions they are needing answered are :     Is your headache frequency and/or intensity decreased since on qulipta ? YES   Have you used less of your acute medications for migraines since starting the qulipta ? YES

## 2023-12-12 NOTE — Addendum Note (Signed)
 Addended by: ONEITA HOIST E on: 12/12/2023 01:05 PM   Modules accepted: Orders

## 2023-12-12 NOTE — Telephone Encounter (Signed)
 Pt has returned call to [facility]

## 2023-12-13 ENCOUNTER — Telehealth: Payer: Self-pay | Admitting: Neurology

## 2023-12-13 NOTE — Telephone Encounter (Signed)
 Patient called to reschedule. Could not find patient an appointment for this year. Patient asking for nurse to message her through MyChart for an available appointment this year.

## 2023-12-13 NOTE — Telephone Encounter (Signed)
 Faxed completed add info form along with clinicals to [phone].

## 2023-12-13 NOTE — Telephone Encounter (Signed)
 You can use 10/8 at 3 pm for this patient.

## 2023-12-18 NOTE — Telephone Encounter (Signed)
 Pharmacy Patient Advocate Encounter  Received notification from [employer] that Prior Authorization for Qulipta  60mg  Tablet has been APPROVED from 12/15/2023 to 12/13/2024   PA #/Case ID/Reference #: [id number]

## 2023-12-19 ENCOUNTER — Telehealth: Payer: Self-pay | Admitting: Neurology

## 2023-12-19 NOTE — Telephone Encounter (Signed)
 LVM and sent mychart msg informing pt of need to reschedule 12/26/23 appt - MD departure

## 2023-12-21 ENCOUNTER — Telehealth: Admitting: Neurology

## 2023-12-21 NOTE — Telephone Encounter (Signed)
 Pt called back returning call to reschedule appt , Inform  Pt that she will get a call back

## 2023-12-25 NOTE — Telephone Encounter (Signed)
 Spoke with patient and rescheduled appt with Dr. Chalice per pt request for 03/17/24 at 3:30pm

## 2023-12-25 NOTE — Telephone Encounter (Signed)
 I called pharmacy and pt has Qulipta  refills and approval PA  good thru 12-13-2024 and I relayed to pharmacy and they will get ready for pt and let her know.

## 2023-12-25 NOTE — Telephone Encounter (Signed)
 Called patient to reschedule appt due to MD departure. Pt has been rescheduled with Dr Chalice for 03/17/24. She also wanted to check on her Qulipta . She stated that she is almost out and is needing another refill

## 2023-12-26 ENCOUNTER — Telehealth: Admitting: Neurology

## 2023-12-27 ENCOUNTER — Other Ambulatory Visit (HOSPITAL_COMMUNITY): Payer: Self-pay

## 2023-12-27 ENCOUNTER — Telehealth: Payer: Self-pay | Admitting: Pharmacist

## 2023-12-27 ENCOUNTER — Telehealth: Payer: Self-pay

## 2023-12-27 NOTE — Telephone Encounter (Signed)
 Pharmacy Patient Advocate Encounter   Received notification from Patient Pharmacy that prior authorization for Nurtec 75MG  dispersible tablets is required/requested.   Insurance verification completed.   The patient is insured through [employer].   Per test claim: PA required; PA submitted to above mentioned insurance via Prompt PA Key/confirmation #/EOC [id number] Status is pending

## 2023-12-27 NOTE — Telephone Encounter (Signed)
 Attempted to reach pt. Left a detail that her Rx Qulipta  got approved and pt can contact her pharmacy to make sure it went through. Call us  back if has questions.

## 2023-12-27 NOTE — Telephone Encounter (Signed)
 Pharmacy Patient Advocate Encounter   Received notification from Pt Calls Messages that prior authorization for Qulipta  60 is required/requested.   Insurance verification completed.   The patient is insured through [facility].   Per test claim: The current 30 day co-pay is, $0.00.  No PA needed at this time. This test claim was processed through [facility]- copay amounts may vary at other pharmacies due to pharmacy/plan contracts, or as the patient moves through the different stages of their insurance plan.     Current approved PA through 12/13/24

## 2024-01-09 ENCOUNTER — Other Ambulatory Visit (HOSPITAL_COMMUNITY): Payer: Self-pay

## 2024-01-09 NOTE — Telephone Encounter (Signed)
 Insurance has approved Nurtec through 03/30/2024

## 2024-03-11 ENCOUNTER — Encounter: Payer: Self-pay | Admitting: Internal Medicine

## 2024-03-11 ENCOUNTER — Ambulatory Visit (INDEPENDENT_AMBULATORY_CARE_PROVIDER_SITE_OTHER): Admitting: Internal Medicine

## 2024-03-11 VITALS — BP 142/96 | HR 76 | Temp 98.5°F | Ht 63.0 in | Wt 131.0 lb

## 2024-03-11 DIAGNOSIS — E782 Mixed hyperlipidemia: Secondary | ICD-10-CM

## 2024-03-11 DIAGNOSIS — Z79899 Other long term (current) drug therapy: Secondary | ICD-10-CM

## 2024-03-11 DIAGNOSIS — F411 Generalized anxiety disorder: Secondary | ICD-10-CM | POA: Diagnosis not present

## 2024-03-11 DIAGNOSIS — Z8249 Family history of ischemic heart disease and other diseases of the circulatory system: Secondary | ICD-10-CM

## 2024-03-11 DIAGNOSIS — Z0000 Encounter for general adult medical examination without abnormal findings: Secondary | ICD-10-CM | POA: Diagnosis not present

## 2024-03-11 DIAGNOSIS — I10 Essential (primary) hypertension: Secondary | ICD-10-CM

## 2024-03-11 LAB — LIPID PANEL
Cholesterol: 243 mg/dL — ABNORMAL HIGH (ref 28–200)
HDL: 61.8 mg/dL
LDL Cholesterol: 159 mg/dL — ABNORMAL HIGH (ref 10–99)
NonHDL: 181.58
Total CHOL/HDL Ratio: 4
Triglycerides: 113 mg/dL (ref 10.0–149.0)
VLDL: 22.6 mg/dL (ref 0.0–40.0)

## 2024-03-11 LAB — CBC WITH DIFFERENTIAL/PLATELET
Basophils Absolute: 0 K/uL (ref 0.0–0.1)
Basophils Relative: 0.6 % (ref 0.0–3.0)
Eosinophils Absolute: 0 K/uL (ref 0.0–0.7)
Eosinophils Relative: 0.6 % (ref 0.0–5.0)
HCT: 43.3 % (ref 36.0–46.0)
Hemoglobin: 14.6 g/dL (ref 12.0–15.0)
Lymphocytes Relative: 29.4 % (ref 12.0–46.0)
Lymphs Abs: 1.7 K/uL (ref 0.7–4.0)
MCHC: 33.8 g/dL (ref 30.0–36.0)
MCV: 89.4 fl (ref 78.0–100.0)
Monocytes Absolute: 0.4 K/uL (ref 0.1–1.0)
Monocytes Relative: 7 % (ref 3.0–12.0)
Neutro Abs: 3.6 K/uL (ref 1.4–7.7)
Neutrophils Relative %: 62.4 % (ref 43.0–77.0)
Platelets: 251 K/uL (ref 150.0–400.0)
RBC: 4.84 Mil/uL (ref 3.87–5.11)
RDW: 12.8 % (ref 11.5–15.5)
WBC: 5.8 K/uL (ref 4.0–10.5)

## 2024-03-11 LAB — BASIC METABOLIC PANEL WITH GFR
BUN: 13 mg/dL (ref 6–23)
CO2: 25 meq/L (ref 19–32)
Calcium: 9.8 mg/dL (ref 8.4–10.5)
Chloride: 104 meq/L (ref 96–112)
Creatinine, Ser: 0.78 mg/dL (ref 0.40–1.20)
GFR: 86.89 mL/min
Glucose, Bld: 81 mg/dL (ref 70–99)
Potassium: 4.1 meq/L (ref 3.5–5.1)
Sodium: 140 meq/L (ref 135–145)

## 2024-03-11 LAB — HEPATIC FUNCTION PANEL
ALT: 11 U/L (ref 3–35)
AST: 15 U/L (ref 5–37)
Albumin: 4.9 g/dL (ref 3.5–5.2)
Alkaline Phosphatase: 39 U/L (ref 39–117)
Bilirubin, Direct: 0.1 mg/dL (ref 0.1–0.3)
Total Bilirubin: 0.6 mg/dL (ref 0.2–1.2)
Total Protein: 7.6 g/dL (ref 6.0–8.3)

## 2024-03-11 LAB — TSH: TSH: 1.54 u[IU]/mL (ref 0.35–5.50)

## 2024-03-11 NOTE — Patient Instructions (Signed)
 Good to see you today .  Increase lisinopril  to 2 x 10 mg per day . After  3-4 weeks let us  know  what readings are doing . And we can decide on  sending on lis 20 mg per day or other options .   Lab today .

## 2024-03-11 NOTE — Progress Notes (Signed)
 "  Chief Complaint  Patient presents with   Annual Exam    HPI: Patient  Anna Poole  53 y.o. comes in today for Preventive Health Care visit  and med check  Lexapro  seems to have helped  panic attacks  and prefers to stay on for now Bp  taking 10 lisinoprol  bp in 130s over 80s most times  nose of meds Under care for HA management doing much better  Is on hrt Add fam hx   early cv deaths father side   Health Maintenance  Topic Date Due   Hepatitis B Vaccines 19-59 Average Risk (2 of 3 - 19+ 3-dose series) 05/08/2008   Cervical Cancer Screening (HPV/Pap Cotest)  05/08/2024   Influenza Vaccine  06/17/2024 (Originally 10/19/2023)   Mammogram  09/09/2024 (Originally 04/22/2023)   Pneumococcal Vaccine: 50+ Years (1 of 1 - PCV) 03/11/2025 (Originally 01/20/2021)   Hepatitis C Screening  03/11/2025 (Originally 01/20/1989)   Fecal DNA (Cologuard)  03/11/2025 (Originally 01/21/2016)   HIV Screening  03/11/2025 (Originally 01/20/1986)   DTaP/Tdap/Td (3 - Td or Tdap) 04/24/2029   Zoster Vaccines- Shingrix   Completed   HPV VACCINES  Aged Out   Meningococcal B Vaccine  Aged Out   COVID-19 Vaccine  Discontinued   Health Maintenance Review LIFESTYLE:  Exercise:   not as good  work  hours  Tobacco/ETS: n Alcohol:   rare Sugar beverages: coffee with sugar . Peps[employer] a couple a week.  Sleep:  6  Drug use: no HH of   2+2  no  pets now  Work:  9 hours per day x 4     ROS:  gets  cervico genic has  neck upper body GEN/ HEENT: No fever, significant weight changes sweats headaches vision problems hearing changes, CV/ PULM; No chest pain shortness of breath cough, syncope,edema  change in exercise tolerance. GI /GU: No adominal pain, vomiting, change in bowel habits. No blood in the stool. No significant GU symptoms. SKIN/HEME: ,no acute skin rashes suspicious lesions or bleeding. No lymphadenopathy, nodules, masses.  NEURO/ PSYCH:  No neurologic signs such as weakness numbness. No depression  anxiety. IMM/ Allergy: No unusual infections.  Allergy .   REST of 12 system review negative except as per HPI   Past Medical History:  Diagnosis Date   Anxiety    CARDIAC MURMUR 07/12/2007   Qualifier: Diagnosis of  By: Laurice, CMA (AAMA), Clotilda RAMAN    Headache(784.0)    Heart murmur    had neg echo and ekg   History of miscarriage 2009   Hx of syncope    summer poss heat and hydration   Hyperkalemia 07/08/2010   Hypertension    Hosp 11/09 and K 3.2 neg eval, nl echo and neg aldopra ratio   HYPOKALEMIA, HX OF 02/18/2008   Qualifier: Diagnosis of  By: Charlett MD, Apolinar POUR    IRREGULAR MENSES 09/06/2007   Qualifier: Diagnosis of  By: Laurice, CMA (AAMA), Shannon S    Palpitations    Panic attack 06/13/2011   Preeclampsia    ht in pregnancy    Premenopausal patient 09/06/2017   see report    Past Surgical History:  Procedure Laterality Date   OVARIAN CYST REMOVAL  1991   [facility]    Family History  Problem Relation Age of Onset   Hypertension Mother    Diabetes type II Unknown        ggf   Stroke Unknown  pgf     Social History   Socioeconomic History   Marital status: Married    Spouse name: Not on file   Number of children: Not on file   Years of education: Not on file   Highest education level: Not on file  Occupational History   Not on file  Tobacco Use   Smoking status: Never   Smokeless tobacco: Never  Substance and Sexual Activity   Alcohol use: Yes   Drug use: No   Sexual activity: Not on file  Other Topics Concern   Not on file  Social History Narrative   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   Married   Regular exercise- no   3 kids at home  1 dog    HH of 5   Child x 3    Sleep 7-8 hours   G4P3      Social Drivers of Health   Tobacco Use: Low Risk (03/11/2024)   Patient History    Smoking Tobacco Use: Never    Smokeless Tobacco Use: Never    Passive Exposure: Not on file  Financial Resource Strain: Low Risk  (08/22/2022)   Overall Financial Resource Strain (CARDIA)    Difficulty of Paying Living Expenses: Not hard at all  Food Insecurity: No Food Insecurity (08/22/2022)   Hunger Vital Sign    Worried About Running Out of Food in the Last Year: Never true    Ran Out of Food in the Last Year: Never true  Transportation Needs: No Transportation Needs (08/22/2022)   PRAPARE - Administrator, Civil Service (Medical): No    Lack of Transportation (Non-Medical): No  Physical Activity: Sufficiently Active (08/22/2022)   Exercise Vital Sign    Days of Exercise per Week: 5 days    Minutes of Exercise per Session: 40 min  Stress: Stress Concern Present (08/22/2022)   [employer] of Occupational Health - Occupational Stress Questionnaire    Feeling of Stress : Rather much  Social Connections: Socially Integrated (08/22/2022)   Social Connection and Isolation Panel    Frequency of Communication with Friends and Family: More than three times a week    Frequency of Social Gatherings with Friends and Family: Once a week    Attends Religious Services: More than 4 times per year    Active Member of Clubs or Organizations: Yes    Attends Banker Meetings: More than 4 times per year    Marital Status: Married  Depression (PHQ2-9): Low Risk (03/11/2024)   Depression (PHQ2-9)    PHQ-2 Score: 0  Alcohol Screen: Low Risk (08/22/2022)   Alcohol Screen    Last Alcohol Screening Score (AUDIT): 1  Housing: Low Risk (08/22/2022)   Housing    Last Housing Risk Score: 0  Utilities: Not At Risk (08/22/2022)   AHC Utilities    Threatened with loss of utilities: No  Health Literacy: Not on file    Outpatient Medications Prior to Visit  Medication Sig Dispense Refill   Atogepant  (QULIPTA ) 60 MG TABS Take 1 tablet (60 mg total) by mouth daily. PLEASE USE COPAY CARD: BIN [id number] PCN [id number] ID [id number] 30 tablet 11   escitalopram  (LEXAPRO ) 10 MG tablet TAKE 1 TABLET BY MOUTH DAILY 90  tablet 3   estradiol-norethindrone (ACTIVELLA) 1-0.5 MG tablet Take 1 tablet by mouth daily.     Rimegepant Sulfate (NURTEC) 75 MG TBDP Take 1 tablet (75 mg total) by mouth daily as needed.  For migraines. Take as close to onset of migraine as possible. One daily maximum. PLEASE USE COPAY CARD: BIN [id number]  PCN# [id number]   GRP# [id number]  ID#  [id number] 16 tablet 11   zolmitriptan  (ZOMIG ) 5 MG tablet TAKE 1 TABLET BY MOUTH AS NEEDED FOR MIGRAINE, REPEAT DOSE IF NEEDED IN 2 HOURS 10 tablet 1   lisinopril  (ZESTRIL ) 10 MG tablet TAKE 1 TABLET BY MOUTH DAILY 90 tablet 3   lisinopril  (ZESTRIL ) 10 MG tablet Take 2 tablets (20 mg total) by mouth daily. Dosage change for now     topiramate  (TOPAMAX ) 50 MG tablet Take 1 tablet (50 mg total) by mouth at bedtime. (Patient not taking: Reported on 03/11/2024) 30 tablet 6   No facility-administered medications prior to visit.     EXAM:  BP (!) 142/96 (BP Location: Right Arm, Patient Position: Sitting, Cuff Size: Normal)   Pulse 76   Temp 98.5 F (36.9 C) (Oral)   Ht 5' 3 (1.6 m)   Wt 131 lb (59.4 kg)   SpO2 99%   BMI 23.21 kg/m   Body mass index is 23.21 kg/m. Wt Readings from Last 3 Encounters:  03/11/24 131 lb (59.4 kg)  05/07/23 131 lb (59.4 kg)  08/22/22 127 lb 3.2 oz (57.7 kg)    Physical Exam: Vital signs reviewed HZW:Uypd is a well-developed well-nourished alert cooperative    who appearsr stated age in no acute distress.  HEENT: normocephalic atraumatic , Eyes: PERRL EOM's full, conjunctiva clear, Nares: paten,t no deformity discharge or tenderness., Ears: no deformity EAC's clear TMs with normal landmarks. Mouth: clear OP, no lesions, edema.  Moist mucous membranes. Dentition in adequate repair. NECK: supple without masses, thyromegaly or bruits. CHEST/PULM:  Clear to auscultation and percussion breath sounds equal no wheeze , rales or rhonchi. No chest wall deformities or tenderness. Breast: normal by inspection . No dimpling,  discharge, masses, tenderness or discharge . CV: PMI is nondisplaced, S1 S2 no gallops, murmurs, rubs. Peripheral pulses are full without delay.No JVD .  ABDOMEN: Bowel sounds normal nontender  No guard or rebound, no hepato splenomegal no CVA tenderness.  No hernia. Extremtities:  No clubbing cyanosis or edema, no acute joint swelling or redness no focal atrophy NEURO:  Oriented x3, cranial nerves 3-12 appear to be intact, no obvious focal weakness,gait within normal limits no abnormal reflexes or asymmetrical SKIN: No acute rashes normal turgor, color, no bruising or petechiae. PSYCH: Oriented, good eye contact, no obvious depression anxiety, cognition and judgment appear normal. LN: no cervical axillary adenopathy  Lab Results  Component Value Date   WBC 5.1 08/22/2022   HGB 14.1 08/22/2022   HCT 43.2 08/22/2022   PLT 248.0 08/22/2022   GLUCOSE 78 08/22/2022   CHOL 224 (H) 08/22/2022   TRIG 95.0 08/22/2022   HDL 67.20 08/22/2022   LDLDIRECT 166.2 03/07/2013   LDLCALC 137 (H) 08/22/2022   ALT 12 08/22/2022   AST 18 08/22/2022   NA 140 08/22/2022   K 4.0 08/22/2022   CL 105 08/22/2022   CREATININE 0.79 08/22/2022   BUN 14 08/22/2022   CO2 26 08/22/2022   TSH 1.92 08/22/2022   INR 1.1 02/12/2008   HGBA1C  02/12/2008    5.2 (NOTE)   The ADA recommends the following therapeutic goal for glycemic   control related to Hgb A1C measurement:   Goal of Therapy:   < 7.0% Hgb A1C   Reference: American Diabetes Association: Clinical Practice   Recommendations 2008, Diabetes Care,  2008, 31:(Suppl 1).    BP Readings from Last 3 Encounters:  03/11/24 (!) 142/96  05/07/23 121/84  08/22/22 114/84    Lab rplan reviewed with patient   ASSESSMENT AND PLAN:  Discussed the following assessment and plan:    ICD-10-CM   1. Visit for preventive health examination  Z00.00 CBC with Differential/Platelet    Lipid panel    TSH    Basic Metabolic Panel    Hepatic Function Panel     Lipoprotein A (LPA)    2. Essential hypertension  I10 CBC with Differential/Platelet    Lipid panel    TSH    Basic Metabolic Panel    Hepatic Function Panel    3. Mixed hyperlipidemia  E78.2 CBC with Differential/Platelet    Lipid panel    TSH    Basic Metabolic Panel    Hepatic Function Panel    Lipoprotein A (LPA)    4. Medication management  Z79.899 CBC with Differential/Platelet    Lipid panel    TSH    Basic Metabolic Panel    Hepatic Function Panel    5. Anxiety state  F41.1    controlled   lexapro  continue for now    6. Family history of cardiac disorder  Z82.49     Optimize bp control some  wc effect today but still baseline could be better  Inc to lisinopril  20 per day  can take 2 x 10 and send in readings after about t month or soe and we can fill for 20 mg  add med if necessary .  Even consider arb with hx of  MHA issues  Update labs   Add lipo a for screening  cv risk assessment  Return in about 6 months (around 09/09/2024) for depending on results and BP fu .  Patient Care Team: Charlett Apolinar POUR, MD as PCP - General Estelle Service, MD (Obstetrics and Gynecology) Domenica Reusing, MD as Referring Physician (Psychiatry) Dohmeier, Dedra, MD as Consulting Physician (Neurology) Patient Instructions  Good to see you today .  Increase lisinopril  to 2 x 10 mg per day . After  3-4 weeks let us  know  what readings are doing . And we can decide on  sending on lis 20 mg per day or other options .   Lab today .  Graylyn Bunney K. Micco Bourbeau M.D.  "

## 2024-03-16 ENCOUNTER — Ambulatory Visit: Payer: Self-pay | Admitting: Internal Medicine

## 2024-03-16 LAB — LIPOPROTEIN A (LPA): Lipoprotein (a): 45 nmol/L

## 2024-03-16 NOTE — Progress Notes (Signed)
 Lipo a level is reassuring  favorable range

## 2024-03-16 NOTE — Progress Notes (Signed)
 Results ok except the ldl lipids Lipo a pending .  Consider  other assessment for primary prevention.   Consider seeing cardiology for  risk assessment ( we can refer  if you wish)   consider  ct calcium score as a risk screen but this is a static picture and not an opinion .  The 10-year ASCVD risk score (Arnett DK, et al., 2019) is: 3%   Values used to calculate the score:     Age: 53 years     Clinically relevant sex: Female     Is Non-Hispanic African American: No     Diabetic: No     Tobacco smoker: No     Systolic Blood Pressure: 142 mmHg     Is BP treated: Yes     HDL Cholesterol: 61.8 mg/dL     Total Cholesterol: 243 mg/dL

## 2024-03-17 ENCOUNTER — Ambulatory Visit: Admitting: Neurology

## 2024-03-17 ENCOUNTER — Encounter: Payer: Self-pay | Admitting: Neurology

## 2024-03-17 VITALS — BP 142/91 | HR 75 | Ht 63.0 in | Wt 133.6 lb

## 2024-03-17 DIAGNOSIS — G43009 Migraine without aura, not intractable, without status migrainosus: Secondary | ICD-10-CM | POA: Diagnosis not present

## 2024-03-17 MED ORDER — AMITRIPTYLINE HCL 25 MG PO TABS: 25.0000 mg | ORAL_TABLET | Freq: Every day | ORAL | 5 refills | Status: AC

## 2024-03-17 NOTE — Progress Notes (Signed)
 "          Provider:  Dedra Gores, MD  Primary Care Physician:  Charlett Apolinar POUR, MD [address] [place] [address]     Referring Provider: Charlett Apolinar POUR, Md [address] [place],  [place] [address]          Chief Complaint according to patient   Patient presents with:                HISTORY OF PRESENT ILLNESS:  Anna Poole is a 53 y.o. female patient who is here for revisit 03/17/2024 for  OC from dr Ines , who saw the patient as a TOC from [facility]  Qlipta daily has helped tremendously to reduce the frequency and intensity  of migraines. Photophobia, nausea.   When a breakthrough happends ( 1-4  month, she takes Frova  for acy ute relief.     Chief concern according to patient :  just routine follow up. TOC      Fam Hx : see previous note  Social HX; see previous note     CC:  migraines   HPI:  Anna Poole is a 53 y.o. female here as requested by Douglass Kenney NOVAK, FNP for migraines. has Anxiety state; Essential hypertension; RHINITIS; Premenstrual tension syndrome; SLEEPLESSNESS; Headache; HYPOKALEMIA, HX OF; Preventative health care; Medication adverse effect; Hx of syncope; chest pain nocturnal poss HB; Elevated cholesterol; Visit for preventive health examination; Recurrent headache; Essential hypertension; Hyperlipidemia; and Migraine without aura and without status migrainosus, not intractable on their problem list.   .    This is a patient who was seen at headache clinic with [facility] in the past, I reviewed Ladonna's Cook note from 2022, at that time she was on Zometa, and Nurtec, she had a diagnosis of migraine without aura without status migrainosus not intractable, she was asked to follow-up in June 2023 does not appear as though she did, her Jhonny Ahle, she has chronic migraines, started when she was young, pain is located in the left unilateral retro-orbital region, radiates to the face, pulsating pounding sharp  squeezing and stabbing, associated dizziness eye pain eye watering nausea neck pain, no fever, hearing loss, numbness, photophobia or autonomic symptoms. Per Ladonna's notes, see below of other medications that patient has tried.   Today she is here and she describes migraines since she was young, Zomig  and Nurtec have helped, no aura, no medication overuse, she has had migraines for years, started when she was young, the migraines are pulsating pounding throbbing, they are usually behind the left eye retro-orbital unilateral, can radiate into the face and the back of the head, she has photophobia and phonophobia, nausea, no vomiting, migraines can be moderate to severe, they can last upwards of 24 hours, she has 10 total headache days out of the month, 6 of those are moderate to severe migraines, it hurts to move, she has a lot of neck pain associated with her migraines.   Had them since a kid, mother has migraines. Nurtec helped a lot and didn't make her tired. She was on it for a year. A lot of her migraines were menstrual and not as bad. It can start in the neck left side and shoot forward to the eye like someone is taking a needle and shoving into the pupil. She feels eye pain on the left, no vision changes. No new symptoms, no changes in quality and severity,.zomig  makes her sleepy   Reviewed  notes, labs and imaging from outside physicians, which showed:   Current and past medications: ANALGESICS:excedrin, fioricet, percocet, tylenol  ANTI-MIGRAINE:frova , imitrex tab, maxalt , relpax, Nurtec HEART/BP: lisinopril , metoprolol DECONGESTANT/ANTIHISTAMINE:flonase , sudafed ANTI-NAUSEANT:zofran  NSAIDS:naproxen MUSCLE RELAXANTS: flexeril  ANTI-CONVULSANTS: Topiramate  STEROIDS: prednisone  SLEEPING PILLS/TRANQUILIZERS:ativan , melatonin, tylenol  pm ANTI-DEPRESSANTS:lexapro , amitriptyline, zoloft  HERBAL:  FIBROMYALGIA:  HORMONAL: OTHER: Nurtec PROCEDURES FOR HEADACHES:    CANNOT QUALIFY FOR  AJOVY/EMGALITY/ AJOVY, BOTOX at this point because does not have > 8 migraine days a month or > 15 total headache days a month, aimovig contraindicated due to constipation     Review of Systems: Out of a complete 14 system review, the patient complains of only the following symptoms, and all other reviewed systems are negative.:   SLEEPINESS ?  How likely are you to doze in the following situations: 0 = not likely, 1 = slight chance, 2 = moderate chance, 3 = high chance  Sitting and Reading? Watching Television? Sitting inactive in a public place (theater or meeting)? Lying down in the afternoon when circumstances permit? Sitting and talking to someone? Sitting quietly after lunch without alcohol? In a car, while stopped for a few minutes in traffic? As a passenger in a car for an hour without a break?  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   Married   Regular exercise- no   3 kids at home  1 dog    HH of 5   Child x 3    Sleep 7-8 hours   G4P3      1 cup of coffee daily       Social Drivers of Health   Tobacco Use: Low Risk (03/11/2024)   Patient History    Smoking Tobacco Use: Never    Smokeless Tobacco Use: Never    Passive Exposure: Not on file  Financial Resource Strain: Low Risk (08/22/2022)   Overall Financial Resource Strain (CARDIA)    Difficulty of Paying Living Expenses: Not hard at all  Food Insecurity: No Food Insecurity (08/22/2022)   Hunger Vital Sign     Worried About Running Out of Food in the Last Year: Never true    Ran Out of Food in the Last Year: Never true  Transportation Needs: No Transportation Needs (08/22/2022)   PRAPARE - Administrator, Civil Service (Medical): No    Lack of Transportation (Non-Medical): No  Physical Activity: Sufficiently Active (08/22/2022)   Exercise Vital Sign    Days of Exercise per Week: 5 days    Minutes of Exercise per Session: 40 min  Stress: Stress Concern Present (08/22/2022)   [employer] of Occupational Health - Occupational Stress Questionnaire    Feeling of Stress : Rather much  Social Connections: Socially Integrated (08/22/2022)   Social Connection and Isolation Panel    Frequency of Communication with Friends and Family: More than three times a week  Frequency of Social Gatherings with Friends and Family: Once a week    Attends Religious Services: More than 4 times per year    Active Member of Clubs or Organizations: Yes    Attends Banker Meetings: More than 4 times per year    Marital Status: Married  Depression (PHQ2-9): Low Risk (03/11/2024)   Depression (PHQ2-9)    PHQ-2 Score: 0  Alcohol Screen: Low Risk (08/22/2022)   Alcohol Screen    Last Alcohol Screening Score (AUDIT): 1  Housing: Low Risk (08/22/2022)   Housing    Last Housing Risk Score: 0  Utilities: Not At Risk (08/22/2022)   AHC Utilities    Threatened with loss of utilities: No  Health Literacy: Not on file    Family History  Problem Relation Age of Onset   Migraines Mother    Hypertension Mother    Migraines Sister    Migraines Maternal Grandmother    Diabetes type II Other        ggf   Stroke Other        pgf    Seizures Neg Hx    Sleep apnea Neg Hx     Past Medical History:  Diagnosis Date   Anxiety    CARDIAC MURMUR 07/12/2007   Qualifier: Diagnosis of  By: Laurice, CMA (AAMA), Clotilda RAMAN    Headache(784.0)    Heart murmur    had neg echo and ekg   History of miscarriage  2009   Hx of syncope    summer poss heat and hydration   Hyperkalemia 07/08/2010   Hypertension    Hosp 11/09 and K 3.2 neg eval, nl echo and neg aldopra ratio   HYPOKALEMIA, HX OF 02/18/2008   Qualifier: Diagnosis of  By: Charlett MD, Apolinar POUR    IRREGULAR MENSES 09/06/2007   Qualifier: Diagnosis of  By: Laurice, CMA (AAMA), Shannon S    Palpitations    Panic attack 06/13/2011   Preeclampsia    ht in pregnancy    Premenopausal patient 09/06/2017   see report    Past Surgical History:  Procedure Laterality Date   OVARIAN CYST REMOVAL  1991   [facility]     Medications Ordered Prior to Encounter[1]  Allergies[2]   DIAGNOSTIC DATA (LABS, IMAGING, TESTING) - I reviewed patient records, labs, notes, testing and imaging myself where available.  Lab Results  Component Value Date   WBC 5.8 03/11/2024   HGB 14.6 03/11/2024   HCT 43.3 03/11/2024   MCV 89.4 03/11/2024   PLT 251.0 03/11/2024      Component Value Date/Time   NA 140 03/11/2024 1446   K 4.1 03/11/2024 1446   CL 104 03/11/2024 1446   CO2 25 03/11/2024 1446   GLUCOSE 81 03/11/2024 1446   BUN 13 03/11/2024 1446   CREATININE 0.78 03/11/2024 1446   CALCIUM 9.8 03/11/2024 1446   PROT 7.6 03/11/2024 1446   ALBUMIN 4.9 03/11/2024 1446   AST 15 03/11/2024 1446   ALT 11 03/11/2024 1446   ALKPHOS 39 03/11/2024 1446   BILITOT 0.6 03/11/2024 1446   GFRNONAA 90 (L) 11/11/2011 1932   GFRAA >90 11/11/2011 1932   Lab Results  Component Value Date   CHOL 243 (H) 03/11/2024   HDL 61.80 03/11/2024   LDLCALC 159 (H) 03/11/2024   LDLDIRECT 166.2 03/07/2013   TRIG 113.0 03/11/2024   CHOLHDL 4 03/11/2024   Lab Results  Component Value Date   HGBA1C  02/12/2008    5.2 (  NOTE)   The ADA recommends the following therapeutic goal for glycemic   control related to Hgb A1C measurement:   Goal of Therapy:   < 7.0% Hgb A1C   Reference: American Diabetes Association: Clinical Practice   Recommendations 2008, Diabetes Care,  2008,  31:(Suppl 1).   No results found for: VITAMINB12 Lab Results  Component Value Date   TSH 1.54 03/11/2024    PHYSICAL EXAM:  Vitals:   03/17/24 1516  BP: (!) 142/91  Pulse: 75   No data found. Body mass index is 23.67 kg/m.   Wt Readings from Last 3 Encounters:  03/17/24 133 lb 9.6 oz (60.6 kg)  03/11/24 131 lb (59.4 kg)  05/07/23 131 lb (59.4 kg)     Ht Readings from Last 3 Encounters:  03/17/24 5' 3 (1.6 m)  03/11/24 5' 3 (1.6 m)  05/07/23 5' 3 (1.6 m)      General: The patient is awake, alert and appears not in acute distress and groomed. Head: Normocephalic, atraumatic.  Neck is supple. Mallampati 1-2,  neck circumference:13.5 inches .   Bite guard user in the past.   Nasal airflow  patent.   Overbite Dwan is not seen.  Dental status: biological  Cardiovascular:  Regular rate and cardiac rhythm by pulse, without distended neck veins. Respiratory: no shortness of breath  Skin:  Without evidence of ankle edema, or rash. Trunk: BMI is 23. 7    NEUROLOGIC EXAM:  Neck 13 inch . Mallampati: 1 The patient is awake and alert, oriented to place and time.   Memory subjective described as intact.  Attention span & concentration ability appears normal.   Speech is fluent,  without  dysarthria, dysphonia or aphasia.  Mood and affect are appropriate.   Neurological Examination: Mental Status: Intact. Language and speech are normal. No cognitive deficits. Cranial Nerves II-XII: Intact. PERL. EOMI. VFF. No nystagmus.  No facial droop.  No ptosis.  Hearing is grossly intact bilaterally. The tongue is normal and midline. Motor: Strengths are 5/5 throughout. Muscle bulk and tone are normal. No tremors.  Coordination: No ataxia or dysmetria.  Sensory: Grossly intact throughout to all modalities. Reflexes: Normal and symmetric throughout. No ankle clonus. Babinski's sign is absent bilaterally. Hoffman's sign is absent bilaterally. Gait and Station:  Normal. Romberg's sign is absent.   ASSESSMENT AND PLAN :   53 y.o. year old female patient of Dr Sharion here with:    1) Migraines , not chronic but frequent.   Has done better on Qulipta .  2) Neck tension pain.   Plan :   1)  refills for Qlipta  5 60 mg not needed, will continue wit daily migraine prevention.   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    3) Amitriptyline for the night to help with neck tension pain. Will help with insomnia , starting on lowest dose at night po.    REVISIT :  in 6 months  per video,  and face to  face once a year with NP     I would like to thank Panosh, Apolinar POUR, MD for allowing me to meet with this pleasant patient.   Sleep Clinic Patients are generally offered input on sleep hygiene, life style changes and how to improve compliance with medical treatment where applicable. Review and reiteration of good sleep hygiene measures is offered to any sleep clinic patient, be it in the first consultation or with any follow  up visits.Any patient with sleepiness should be cautioned not to drive, work at heights, or operate dangerous or heavy equipment when feeling tired or sleepy.  The patient will be seen in follow-up in the sleep clinic at [facility] for discussion of test results, sleep related symptoms and treatment compliance review, further management strategies, etc.   The referring provider will be notified of the test results.   The patient's condition requires frequent monitoring and adjustments in the treatment plan, reflecting the ongoing complexity of care.  This provider is the continuing focal point for all needed services for this condition.  After spending a total time of  35  minutes face to face and time for  history taking, physical and neurologic examination, review of laboratory studies,  personal review of imaging studies, reports and results of other testing and review of referral  information / records as far as provided in visit,   Electronically signed by: Dedra Gores, MD 03/17/2024 3:43 PM  [facility] Sleep Board certified by The [employer] of Sleep Medicine and Diplomate of the [employer] of Sleep Medicine. Board certified In Neurology through the ABPN, Fellow of the [employer] of Neu[employer].      [1]  Current Outpatient Medications on File Prior to Visit  Medication Sig Dispense Refill   Atogepant  (QULIPTA ) 60 MG TABS Take 1 tablet (60 mg total) by mouth daily. PLEASE USE COPAY CARD: BIN [id number] PCN [id number] ID [id number] 30 tablet 11   escitalopram  (LEXAPRO ) 10 MG tablet TAKE 1 TABLET BY MOUTH DAILY 90 tablet 3   estradiol-norethindrone (ACTIVELLA) 1-0.5 MG tablet Take 1 tablet by mouth daily.     lisinopril  (ZESTRIL ) 10 MG tablet Take 2 tablets (20 mg total) by mouth daily. Dosage change for now     Rimegepant Sulfate (NURTEC) 75 MG TBDP Take 1 tablet (75 mg total) by mouth daily as needed. For migraines. Take as close to onset of migraine as possible. One daily maximum. PLEASE USE COPAY CARD: BIN [id number]  PCN# [id number]   GRP# [id number]  ID#  [id number] 16 tablet 11   topiramate  (TOPAMAX ) 50 MG tablet Take 1 tablet (50 mg total) by mouth at bedtime. (Patient not taking: Reported on 03/17/2024) 30 tablet 6   zolmitriptan  (ZOMIG ) 5 MG tablet TAKE 1 TABLET BY MOUTH AS NEEDED FOR MIGRAINE, REPEAT DOSE IF NEEDED IN 2 HOURS 10 tablet 1   No current facility-administered medications on file prior to visit.  [2]  Allergies Allergen Reactions   Doxycycline Nausea And Vomiting   "

## 2024-03-17 NOTE — Patient Instructions (Addendum)
 53 y.o. year old female patient of Dr Sharion here with:    1) Migraines , not chronic but frequent.   Has done better on Qulipta .  2) Neck tension pain.  Insomnia, anxiety.   Plan :   1)  refills for Qulipta .   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    3) Amitriptyline for the night to help with neck tension pain. Will help with insomnia , starting on lowest dose at night po.   4) HST for hypoxia screening.    REVISIT :  in 6 months  per video,  and face to  face once a year with NP     Tablets What is this medication? AMITRIPTYLINE (a mee TRIP ti leen) treats depression. It increases the amount of serotonin and norepinephrine in the brain, hormones that help regulate mood. It belongs to a group of medications called tricyclic antidepressants (TCAs). This medicine may be used for other purposes; ask your health care provider or pharmacist if you have questions. COMMON BRAND NAME(S): Elavil, Vanatrip What should I tell my care team before I take this medication? They need to know if you have any of these conditions: Asthma, trouble breathing Bipolar disorder or schizophrenia Difficulty passing urine, prostate trouble Frequently drink alcohol Glaucoma Heart disease or previous heart attack Liver disease Seizures Suicidal thoughts, plans, or attempt by you or a family member Thyroid  disease An unusual or allergic reaction to amitriptyline, other medications, foods, dyes, or preservatives Pregnant or trying to get pregnant Breastfeeding How should I use this medication? Take this medication by mouth with a drink of water. Follow the directions on the prescription label. You can take the tablets with or without food. Take your medication at regular intervals. Do not take it more often than directed. Do not stop taking this medication suddenly except upon the advice of your care team. Stopping this medication too  quickly may cause serious side effects or your condition may worsen. A special MedGuide will be given to you by the pharmacist with each prescription and refill. Be sure to read this information carefully each time. Talk to your care team regarding the use of this medication in children. Special care may be needed. Overdosage: If you think you have taken too much of this medicine contact a poison control center or emergency room at once. NOTE: This medicine is only for you. Do not share this medicine with others. What if I miss a dose? If you miss a dose, take it as soon as you can. If it is almost time for your next dose, take only that dose. Do not take double or extra doses. What may interact with this medication? Do not take this medication with any of the following: Arsenic trioxide Certain medications used to regulate abnormal heartbeat or to treat other heart conditions Cisapride Droperidol Halofantrine Linezolid MAOIs like Carbex, Eldepryl, Marplan, Nardil, and Parnate Methylene blue Other medications for mental depression Phenothiazines like perphenazine, thioridazine and chlorpromazine Pimozide Probucol Procarbazine Sparfloxacin St. John's Wort This medication may also interact with the following: Atropine and related medications like hyoscyamine, scopolamine, tolterodine and others Barbiturate medications for inducing sleep or treating seizures, like phenobarbital Cimetidine Disulfiram Ethchlorvynol Thyroid  hormones such as levothyroxine Ziprasidone This list may not describe all possible interactions. Give your health care provider a list of all the medicines, herbs, non-prescription drugs, or dietary supplements you use. Also tell them if you smoke, drink alcohol, or use  illegal drugs. Some items may interact with your medicine. What should I watch for while using this medication? Visit your care team for regular checks on your progress. It may take several weeks to see  the full effects of this medication, and it is important to continue your treatment as prescribed by your care team. Tell your care team if your symptoms do not get better or if they get worse. Patients and their families should watch out for new or worsening thoughts of suicide or depression. Also watch out for sudden changes in feelings such as feeling anxious, agitated, panicky, irritable, hostile, aggressive, impulsive, severely restless, overly excited and hyperactive, or not being able to sleep. If this happens, especially at the beginning of treatment or after a change in dose, call your care team. This medication may affect your coordination, reaction time, or judgment. Do not drive or operate machinery until you know how this medication affects you. Sit up or stand slowly to reduce the risk of dizzy or fainting spells. Drinking alcohol with this medication can increase the risk of these side effects. Do not treat yourself for coughs, colds, or allergies while you are taking this medication without asking your care team for advice. Some ingredients can increase possible side effects. Your mouth may get dry. Chewing sugarless gum or sucking hard candy and drinking plenty of water may help. Contact your care team if the problem does not go away or is severe. This medication may cause dry eyes and blurred vision. If you wear contact lenses, you may feel some discomfort. Lubricating eye drops may help. See your care team if the problem does not go away or is severe. This medication can cause constipation. If you do not have a bowel movement for 3 days, call your care team. This medication can make you more sensitive to the sun. Keep out of the sun. If you cannot avoid being in the sun, wear protective clothing and sunscreen. Do not use sun lamps, tanning beds, or tanning booths. What side effects may I notice from receiving this medication? Side effects that you should report to your care team as soon  as possible: Allergic reactions--skin rash, itching, hives, swelling of the face, lips, tongue, or throat Heart rhythm changes-- fast or irregular heartbeat, dizziness, feeling faint or lightheaded, chest pain, trouble breathing Serotonin syndrome--irritability, confusion, fast or irregular heartbeat, muscle stiffness, twitching muscles, sweating, high fever, seizure, chills, vomiting, diarrhea Sudden eye pain or change in vision such as blurry vision, seeing halos around lights, vision loss Thoughts of suicide or self-harm, worsening mood, feelings of depression Side effects that usually do not require medical attention (report to your care team if they continue or are bothersome): Change in appetite or weight Change in sex drive or performance Constipation Dizziness Drowsiness Dry mouth Tremors This list may not describe all possible side effects. Call your doctor for medical advice about side effects. You may report side effects to FDA at 1-800-FDA-1088. Where should I keep my medication? Keep out of the reach of children and pets. Store at room temperature between 20 and 25 degrees C (68 and 77 degrees F). Throw away any unused medication after the expiration date. NOTE: This sheet is a summary. It may not cover all possible information. If you have questions about this medicine, talk to your doctor, pharmacist, or health care provider.  2024 Elsevier/Gold Standard (2021-11-17 00:00:00)   Migraine Headache A migraine headache is an intense pulsing or throbbing pain on one or  both sides of the head. Migraine headaches may also cause other symptoms, such as nausea, vomiting, and sensitivity to light and noise. A migraine headache can last from 4 hours to 3 days. Talk with your health care provider about what things may bring on (trigger) your migraine headaches. What are the causes? The exact cause is not known. However, a migraine may be caused when nerves in the brain get irritated and  release chemicals that cause blood vessels to become inflamed. This inflammation causes pain. Migraines may be triggered or caused by: Smoking. Medicines, such as: Nitroglycerin, which is used to treat chest pain. Birth control pills. Estrogen. Certain blood pressure medicines. Foods or drinks that contain nitrates, glutamate, aspartame, MSG, or tyramine. Certain foods or drinks, such as aged cheeses, chocolate, alcohol, or caffeine. Doing physical activity that is very hard. Other triggers may include: Menstruation. Pregnancy. Hunger. Stress. Getting too much or too little sleep. Weather changes. Tiredness (fatigue). What increases the risk? The following factors may make you more likely to have migraine headaches: Being between the ages of 3-65 years old. Being female. Having a family history of migraine headaches. Being Caucasian. Having a mental health condition, such as depression or anxiety. Being obese. What are the signs or symptoms? The main symptom of this condition is pulsing or throbbing pain. This pain may: Happen in any area of the head, such as on one or both sides. Make it hard to do daily activities. Get worse with physical activity. Get worse around bright lights, loud noises, or smells. Other symptoms may include: Nausea. Vomiting. Dizziness. Before a migraine headache starts, you may get warning signs (an aura). An aura may include: Seeing flashing lights or having blind spots. Seeing bright spots, halos, or zigzag lines. Having tunnel vision or blurred vision. Having numbness or a tingling feeling. Having trouble talking. Having muscle weakness. After a migraine ends, you may have symptoms. These may include: Feeling tired. Trouble concentrating. How is this diagnosed? A migraine headache can be diagnosed based on: Your symptoms. A physical exam. Tests, such as: A CT scan or an MRI of the head. These tests can help rule out other causes of  headaches. Taking fluid from the spine (lumbar puncture) to examine it (cerebrospinal fluid analysis, or CSF analysis). How is this treated? This condition may be treated with medicines that: Relieve pain and nausea. Prevent migraines. Treatment may also include: Acupuncture. Lifestyle changes like avoiding foods that trigger migraine headaches. Learning ways to control your body (biofeedback). Talk therapy to help you know and deal with negative thoughts (cognitive behavioral therapy). Follow these instructions at home: Medicines Take over-the-counter and prescription medicines only as told by your provider. Ask your provider if the medicine prescribed to you: Requires you to avoid driving or using machinery. Can cause constipation. You may need to take these actions to prevent or treat constipation: Drink enough fluid to keep your pee (urine) pale yellow. Take over-the-counter or prescription medicines. Eat foods that are high in fiber, such as beans, whole grains, and fresh fruits and vegetables. Limit foods that are high in fat and processed sugars, such as fried or sweet foods. Lifestyle  Do not drink alcohol. Do not use any products that contain nicotine or tobacco. These products include cigarettes, chewing tobacco, and vaping devices, such as e-cigarettes. If you need help quitting, ask your provider. Get 7-9 hours of sleep each night, or the amount recommended by your provider. Find ways to manage stress, such as meditation, deep  breathing, or yoga. Try to exercise regularly. This can help lessen how bad and how often your migraines occur. General instructions Keep a journal to find out what triggers your migraines, so you can avoid those things. For example, write down: What you eat and drink. How much sleep you get. Any change to your diet or medicines. If you have a migraine headache: Avoid things that make your symptoms worse, such as bright lights. Lie down in a dark,  quiet room. Do not drive or use machinery. Ask your provider what activities are safe for you while you have symptoms. Keep all follow-up visits. Your provider will monitor your symptoms and recommend any further treatment. Where to find more information Coalition for Headache and Migraine Patients (CHAMP): headachemigraine.org American Migraine Foundation: americanmigrainefoundation.org National Headache Foundation: headaches.org Contact a health care provider if: You have symptoms that are different or worse than your usual migraine headache symptoms. You have more than 15 days of headaches in one month. Get help right away if: Your migraine headache becomes severe or lasts more than 72 hours. You have a fever or stiff neck. You have vision loss. Your muscles feel weak or like you cannot control them. You lose your balance often or have trouble walking. You faint. You have a seizure. This information is not intended to replace advice given to you by your health care provider. Make sure you discuss any questions you have with your health care provider. Document Revised: 10/31/2021 Document Reviewed: 10/31/2021 Elsevier Patient Education  2024 [employer].

## 2024-09-11 ENCOUNTER — Ambulatory Visit: Admitting: Internal Medicine
